# Patient Record
Sex: Female | Born: 1985
Health system: Southern US, Community
[De-identification: ages and names within clinical notes are randomized; demographics above are authoritative.]

## PROBLEM LIST (undated history)

## (undated) DIAGNOSIS — O0900 Supervision of pregnancy with history of infertility, unspecified trimester: Secondary | ICD-10-CM

## (undated) DIAGNOSIS — Z8619 Personal history of other infectious and parasitic diseases: Secondary | ICD-10-CM

## (undated) DIAGNOSIS — O409XX0 Polyhydramnios, unspecified trimester, not applicable or unspecified: Secondary | ICD-10-CM

## (undated) DIAGNOSIS — A630 Anogenital (venereal) warts: Secondary | ICD-10-CM

## (undated) DIAGNOSIS — B019 Varicella without complication: Secondary | ICD-10-CM

## (undated) DIAGNOSIS — E282 Polycystic ovarian syndrome: Secondary | ICD-10-CM

## (undated) DIAGNOSIS — D649 Anemia, unspecified: Secondary | ICD-10-CM

## (undated) DIAGNOSIS — G43909 Migraine, unspecified, not intractable, without status migrainosus: Secondary | ICD-10-CM

## (undated) DIAGNOSIS — O403XX0 Polyhydramnios, third trimester, not applicable or unspecified: Secondary | ICD-10-CM

## (undated) HISTORY — PX: WISDOM TOOTH EXTRACTION: SHX21

## (undated) HISTORY — DX: Anogenital (venereal) warts: A63.0

## (undated) HISTORY — PX: COMBINED ABDOMINOPLASTY AND LIPOSUCTION: SUR284

## (undated) HISTORY — DX: Personal history of other infectious and parasitic diseases: Z86.19

## (undated) HISTORY — PX: MOLE REMOVAL: SHX2046

## (undated) HISTORY — DX: Polyhydramnios, unspecified trimester, not applicable or unspecified: O40.9XX0

## (undated) HISTORY — DX: Migraine, unspecified, not intractable, without status migrainosus: G43.909

## (undated) HISTORY — DX: Varicella without complication: B01.9

## (undated) HISTORY — DX: Polycystic ovarian syndrome: E28.2

## (undated) HISTORY — PX: KNEE ARTHROSCOPY: SUR90

## (undated) HISTORY — DX: Anemia, unspecified: D64.9

---
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
## 2004-03-12 ENCOUNTER — Ambulatory Visit (HOSPITAL_BASED_OUTPATIENT_CLINIC_OR_DEPARTMENT_OTHER): Admission: RE | Admit: 2004-03-12 | Discharge: 2004-03-12 | Payer: Self-pay | Admitting: Orthopedic Surgery

## 2004-03-24 ENCOUNTER — Ambulatory Visit (HOSPITAL_COMMUNITY): Admission: RE | Admit: 2004-03-24 | Discharge: 2004-03-24 | Payer: Self-pay | Admitting: Family Medicine

## 2004-07-04 IMAGING — CT CT ABDOMEN W/O CM
1 series · 16 of 32 positions shown, 20 images · non-contrast
Comparison: none

DUPLICATE COPY for exam association in [facility] ? No change from original report, 07/15/03
CLINICAL DATA: Left-sided flank pain with nausea and difficulty urinating. The patient also has hematuria.  
 CT ABDOMEN WITHOUT CONTRAST; CT PELVIS WITHOUT CONTRAST  - 07/10/03
 No prior studies. 
 No IV or oral contrast was administered.  
 CT ABDOMEN
 No evidence of renal calculi or hydronephrosis.  The ureters in the abdomen have a normal appearance. The rest of the unenhanced abdominal study is unremarkable.  
 IMPRESSION 
 No renal calculi or obstruction. 
 CT PELVIS
 Within the ureters and bladder no calculi are identified.  There is a tiny amount of physiologic free fluid in the pelvis. No evidence of calculi in the ureters or bladder.

[Series 2: renal stone · axial · 0.56mm/px · z∈[-398,-88]mm · 16 of 63 slices shown, 20 images]
[im 5/63  soft-tissue]
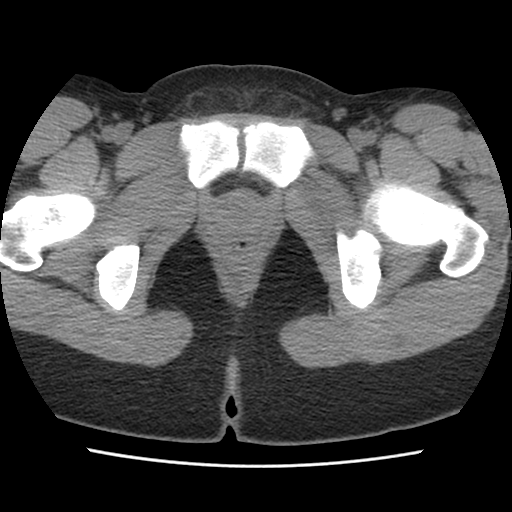
[im 5/63  bone]
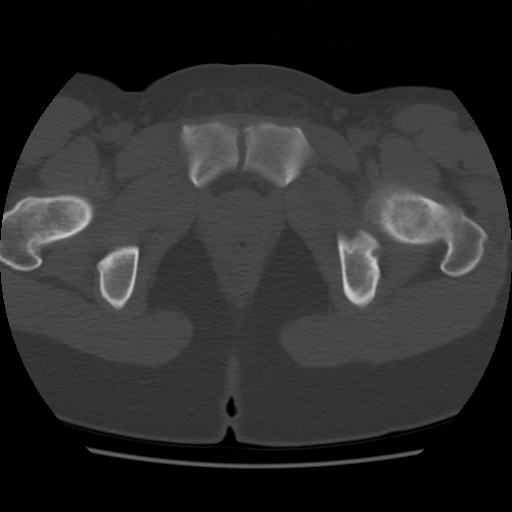
[im 9/63  soft-tissue]
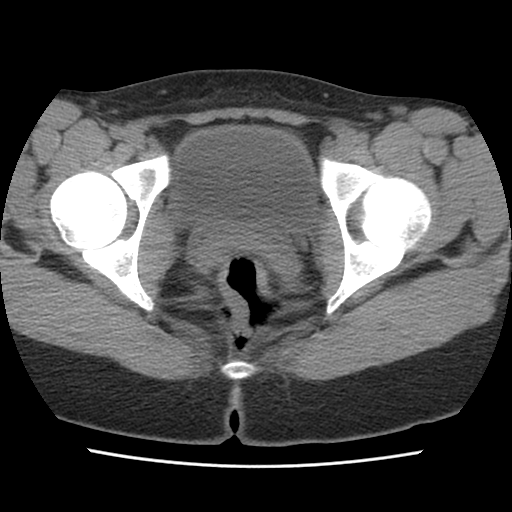
[im 13/63  soft-tissue]
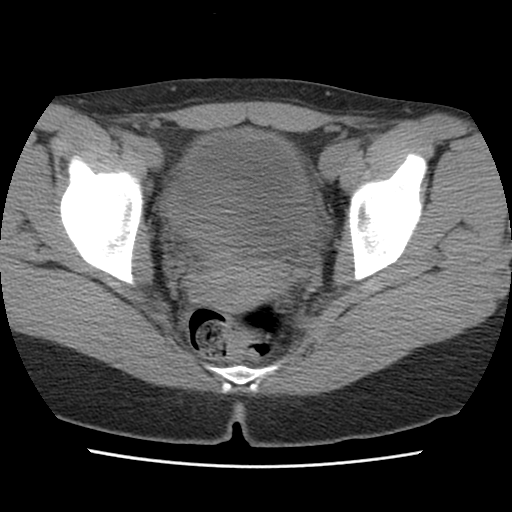
[im 17/63  soft-tissue]
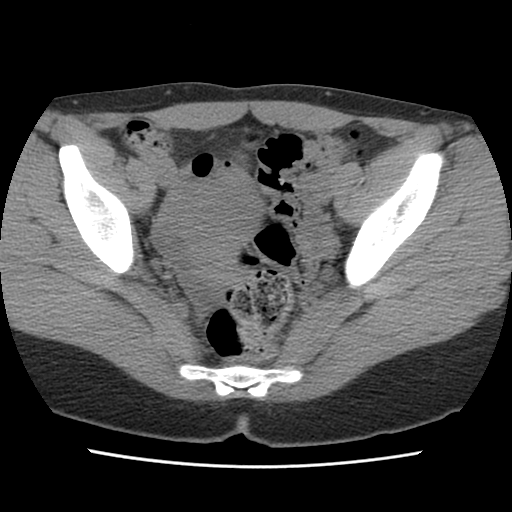
[im 21/63  soft-tissue]
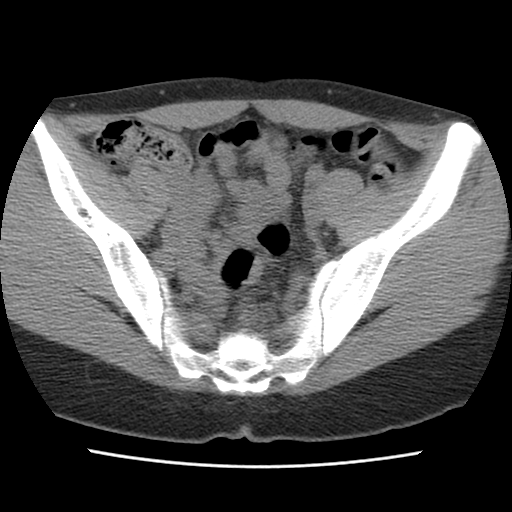
[im 25/63  soft-tissue]
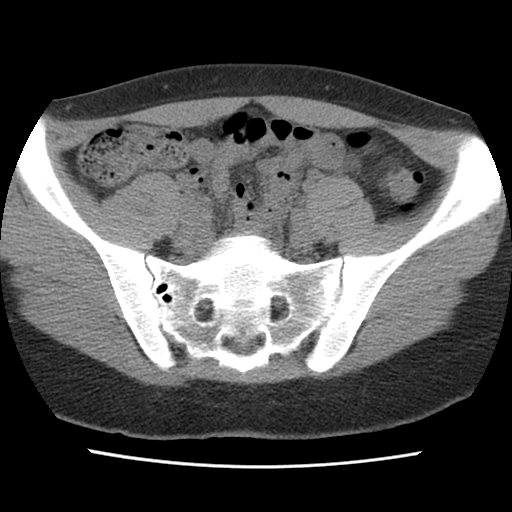
[im 29/63  soft-tissue]
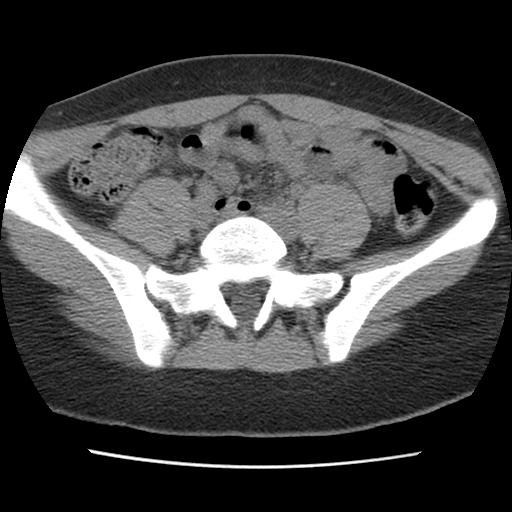
[im 35/63  soft-tissue]
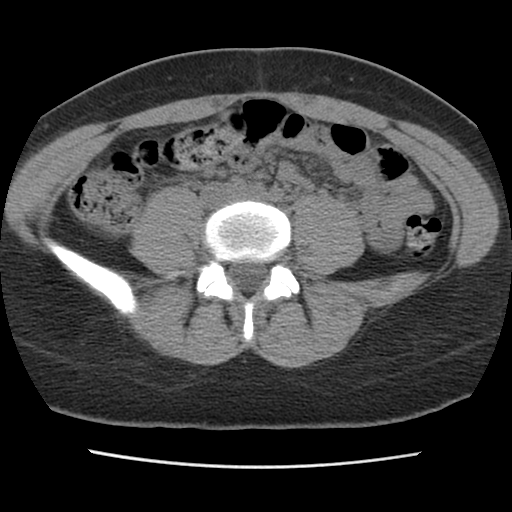
[im 39/63  soft-tissue]
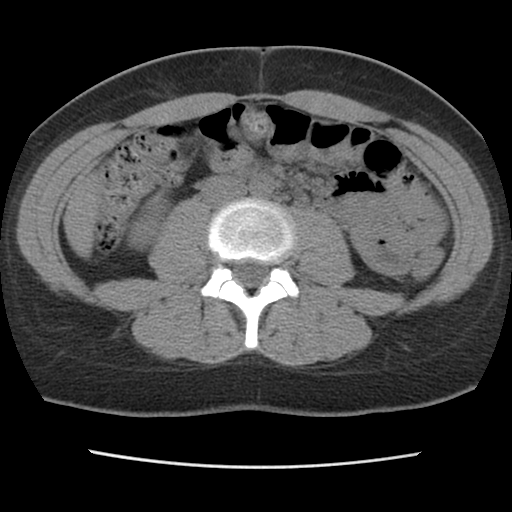
[im 39/63  bone]
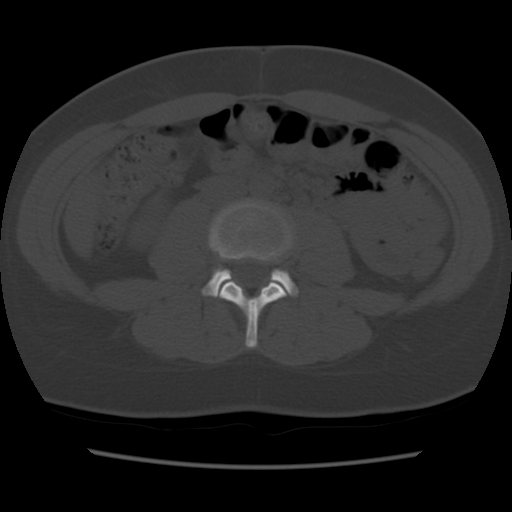
[im 43/63  soft-tissue]
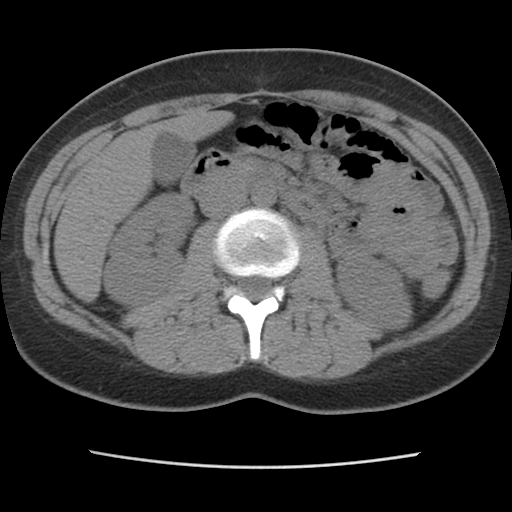
[im 47/63  soft-tissue]
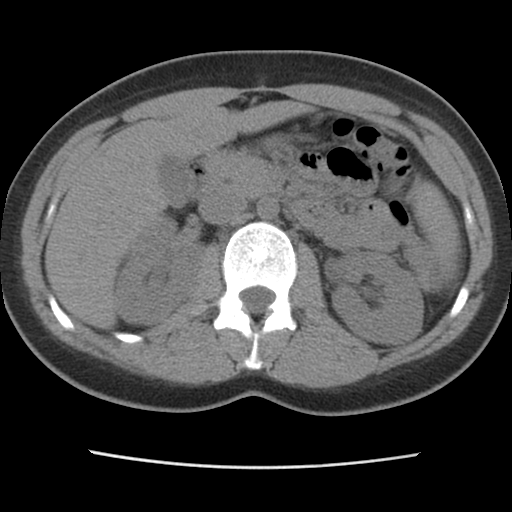
[im 51/63  soft-tissue]
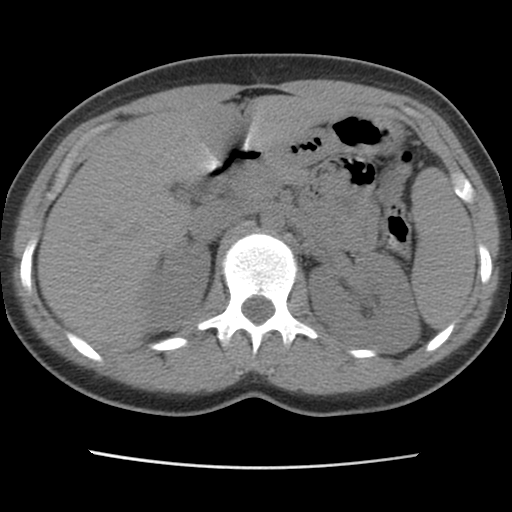
[im 55/63  soft-tissue]
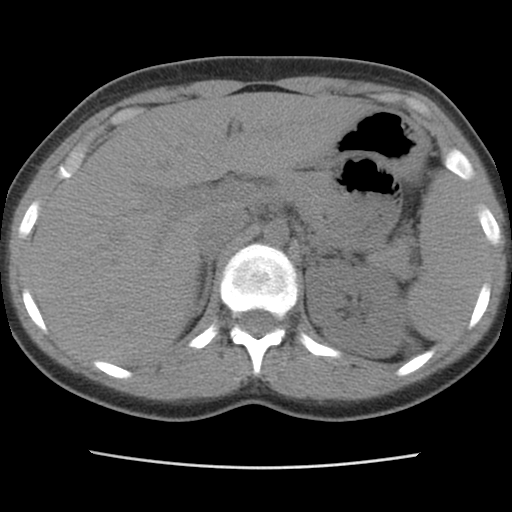
[im 55/63  lung]
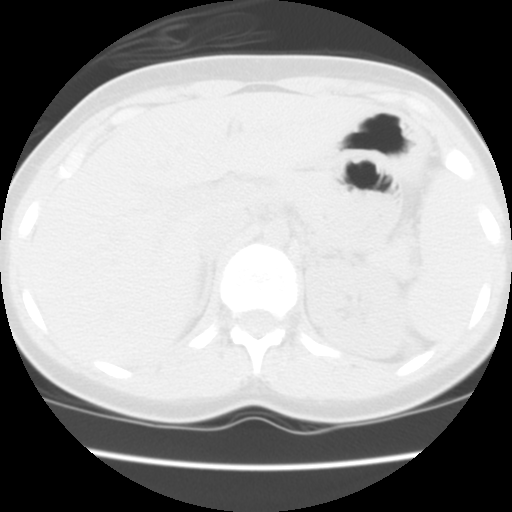
[im 57/63  lung]
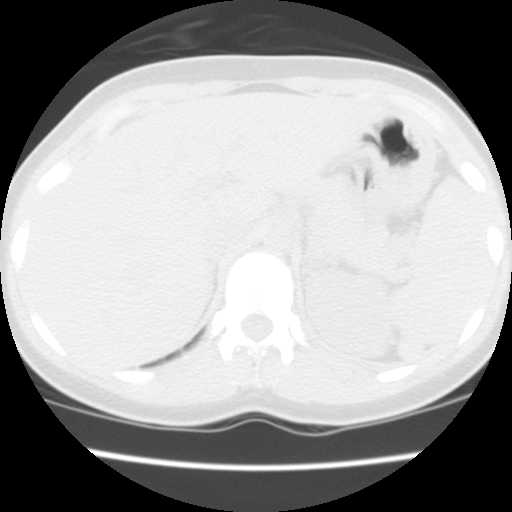
[im 59/63  soft-tissue]
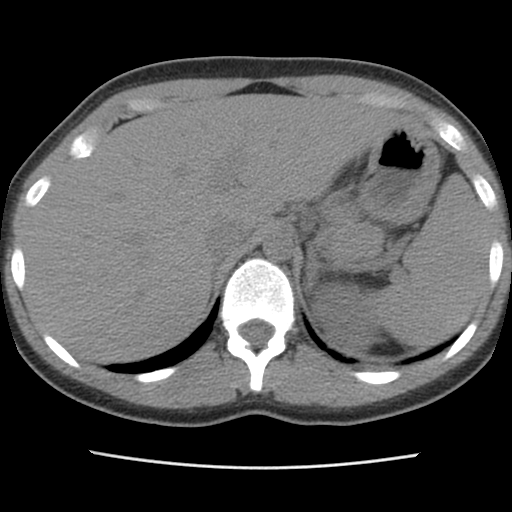
[im 59/63  lung]
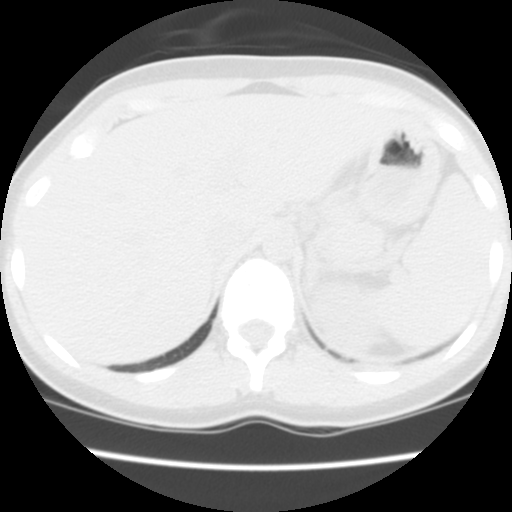
[im 61/63  lung]
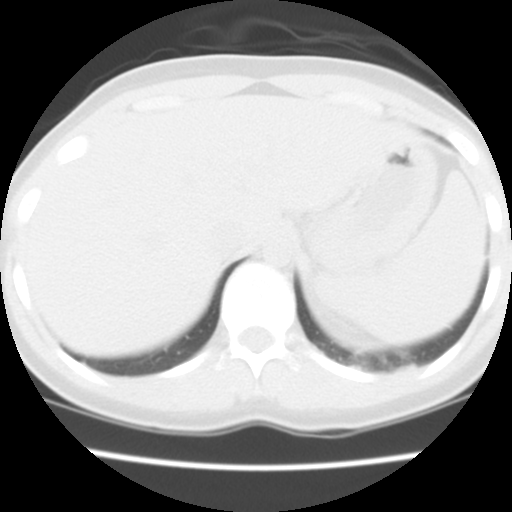

[16 of 32 positions shown; findings below may reference images not displayed]

## 2007-02-22 ENCOUNTER — Emergency Department (HOSPITAL_COMMUNITY): Admission: EM | Admit: 2007-02-22 | Discharge: 2007-02-22 | Payer: Self-pay | Admitting: Family Medicine

## 2010-05-28 NOTE — Op Note (Signed)
NAMEDEMONICA, Janet Holmes             ACCOUNT NO.:  [id number]   MEDICAL RECORD NO.:  [id number]          PATIENT TYPE:  AMB   LOCATION:  DSC                          FACILITY:  [facility]   PHYSICIAN:  Harvie Junior, M.D.   DATE OF BIRTH:  [birth year]   DATE OF PROCEDURE:  03/12/2004  DATE OF DISCHARGE:                                 OPERATIVE REPORT   PREOPERATIVE DIAGNOSIS:  Chondromalacia patella with tight lateral  retinaculum.   POSTOPERATIVE DIAGNOSIS:  Chondromalacia patella with tight lateral  retinaculum.   PROCEDURES:  1.  Chondroplasty posterior patella.  2.  Lateral retinacular release.   SURGEON:  Harvie Junior, M.D.   ASSISTANT:  None.   ANESTHESIA:  MAC with local.   BRIEF HISTORY:  A 25 year old female with long history of having significant  right knee pain.  She ultimately had been seen multiple times.  We have  treated her with anti-inflammatory medications and physical therapy bracing.  None of this seemed to work.  Because of continuing complaints of pain, the  patient is ultimately taken to the operating room for evaluation  arthroscopically and moderate reticular release.   DESCRIPTION OF PROCEDURE:  Patient taken to the operating room and after  adequate anesthesia obtained with MAC, patient placed on the operating  table.  The right leg is prepped and draped in usual sterile fashion.  Following this, routine arthroscopic evaluation of the knee revealed there  was an obvious lateral patellar tilt.  The medial compartment was evaluated  at length and noted to be normal.  There was significant medial plica which  was debrided with __________ anteriorly.  The lateral side was then examined  and noted to have a tight lateral retinaculum.  This was released  arthroscopically.  Patella was allowed to shift superiorly and medially.  At  this point, the patella was evaluated.  There was some chondromalacia  patella which was debrided.  The patella tracking was  more midline at this  point and certainly less pressure in the patellofemoral area. At this point,  the knee was copiously irrigated and suctioned dry.  The arthroscopic  portals were closed with a bandage.  Sterile compressive dressing was  applied as well as a lateral __________.  The patient taken to recovery room  where she was noted to be in satisfactory condition.   ESTIMATED BLOOD LOSS:  None.    JLG/MEDQ  D:  03/12/2004  T:  03/12/2004  Job:  [id number]

## 2010-10-01 LAB — POCT PREGNANCY, URINE
Operator id: 247071
Preg Test, Ur: NEGATIVE

## 2010-10-01 LAB — POCT URINALYSIS DIP (DEVICE)
Bilirubin Urine: NEGATIVE
Glucose, UA: NEGATIVE
Ketones, ur: NEGATIVE
Nitrite: POSITIVE — AB
Operator id: 247071
Protein, ur: 100 — AB
Specific Gravity, Urine: 1.025
Urobilinogen, UA: 0.2
pH: 6.5

## 2011-01-11 NOTE — L&D Delivery Note (Signed)
Delivery Note At 6:35 PM a viable and healthy female was delivered via  (Presentation: ROA).  APGAR: 9, 9; weight pending .   Placenta status: spontaneous,intact .3 vessel  Cord:  with the following complications: Tight Deep Water x one reduced.  Cord pH: na  Anesthesia:  local Episiotomy: none Lacerations: second degree Suture Repair: 2.0 vicryl rapide Est. Blood Loss (mL): 300  Mom to postpartum.  Baby to nursery-stable.  Kahlel Peake J 09/27/2011, 6:52 PM

## 2011-03-07 ENCOUNTER — Other Ambulatory Visit: Payer: Self-pay

## 2011-03-24 LAB — OB RESULTS CONSOLE GC/CHLAMYDIA
Chlamydia: NEGATIVE
Gonorrhea: NEGATIVE

## 2011-03-24 LAB — OB RESULTS CONSOLE ANTIBODY SCREEN: Antibody Screen: NEGATIVE

## 2011-03-24 LAB — OB RESULTS CONSOLE ABO/RH: RH Type: NEGATIVE

## 2011-03-24 LAB — OB RESULTS CONSOLE HEPATITIS B SURFACE ANTIGEN: Hepatitis B Surface Ag: NEGATIVE

## 2011-03-24 LAB — OB RESULTS CONSOLE HIV ANTIBODY (ROUTINE TESTING): HIV: NONREACTIVE

## 2011-03-24 LAB — OB RESULTS CONSOLE RPR: RPR: NONREACTIVE

## 2011-03-24 LAB — OB RESULTS CONSOLE RUBELLA ANTIBODY, IGM: Rubella: UNDETERMINED

## 2011-07-27 ENCOUNTER — Other Ambulatory Visit (HOSPITAL_COMMUNITY): Payer: Self-pay | Admitting: Obstetrics and Gynecology

## 2011-07-27 DIAGNOSIS — O409XX0 Polyhydramnios, unspecified trimester, not applicable or unspecified: Secondary | ICD-10-CM

## 2011-07-29 ENCOUNTER — Ambulatory Visit (HOSPITAL_COMMUNITY)
Admission: RE | Admit: 2011-07-29 | Discharge: 2011-07-29 | Disposition: A | Discharge status: Home / Self Care | Payer: 59 | Source: Ambulatory Visit | Attending: Obstetrics and Gynecology | Admitting: Obstetrics and Gynecology

## 2011-07-29 ENCOUNTER — Encounter (HOSPITAL_COMMUNITY): Payer: Self-pay

## 2011-07-29 DIAGNOSIS — Z363 Encounter for antenatal screening for malformations: Secondary | ICD-10-CM | POA: Insufficient documentation

## 2011-07-29 DIAGNOSIS — O409XX0 Polyhydramnios, unspecified trimester, not applicable or unspecified: Secondary | ICD-10-CM | POA: Insufficient documentation

## 2011-07-29 DIAGNOSIS — O358XX0 Maternal care for other (suspected) fetal abnormality and damage, not applicable or unspecified: Secondary | ICD-10-CM | POA: Insufficient documentation

## 2011-07-29 DIAGNOSIS — Z1389 Encounter for screening for other disorder: Secondary | ICD-10-CM | POA: Insufficient documentation

## 2011-07-29 HISTORY — DX: Supervision of pregnancy with history of infertility, unspecified trimester: O09.00

## 2011-07-29 NOTE — Progress Notes (Signed)
Patient seen today  for ultrasound.  See full report in [facility].  Alpha Gula, MD  Patient is seen today due to polyhydramnios.  The patient reports that she had a normal diabetes screen.  On ultrasound today, an AFI of 27 cm is noted consistent with mild polyhydramnios.  Normal amantomic fetal survey - a normal stomach bubble was clearly visualized.  Findings and limitations of the study were discussed - we reviewed the differential diagnosis for polyhydramnios.  Single IUP at 30 3/7 weeks Normal anatomic fetal survey Fetal growth is appropriate (78th %tile) Mild (idiopathic) polyhydramnios noted  Recommend 2x weekly NSTs.  Consider induction of labor at 39 weeks if undelivered. Recommend follow up ultrasounds as clinically indicated

## 2011-08-29 LAB — OB RESULTS CONSOLE GBS
GBS: NEGATIVE
GBS: POSITIVE

## 2011-09-20 LAB — OB RESULTS CONSOLE GBS: GBS: POSITIVE

## 2011-09-23 ENCOUNTER — Telehealth (HOSPITAL_COMMUNITY): Payer: Self-pay | Admitting: *Deleted

## 2011-09-23 ENCOUNTER — Encounter (HOSPITAL_COMMUNITY): Payer: Self-pay | Admitting: *Deleted

## 2011-09-23 NOTE — Telephone Encounter (Signed)
Preadmission screen  

## 2011-09-26 ENCOUNTER — Encounter (HOSPITAL_COMMUNITY): Payer: Self-pay

## 2011-09-26 ENCOUNTER — Inpatient Hospital Stay (HOSPITAL_COMMUNITY)
Admission: RE | Admit: 2011-09-26 | Discharge: 2011-09-29 | DRG: 775 | Disposition: A | Discharge status: Home / Self Care | Payer: 59 | Source: Ambulatory Visit | Attending: Obstetrics | Admitting: Obstetrics

## 2011-09-26 DIAGNOSIS — Z22330 Carrier of Group B streptococcus: Secondary | ICD-10-CM

## 2011-09-26 DIAGNOSIS — O99892 Other specified diseases and conditions complicating childbirth: Secondary | ICD-10-CM | POA: Diagnosis present

## 2011-09-26 DIAGNOSIS — O403XX0 Polyhydramnios, third trimester, not applicable or unspecified: Secondary | ICD-10-CM | POA: Diagnosis present

## 2011-09-26 DIAGNOSIS — O409XX0 Polyhydramnios, unspecified trimester, not applicable or unspecified: Principal | ICD-10-CM | POA: Diagnosis present

## 2011-09-26 HISTORY — DX: Polyhydramnios, third trimester, not applicable or unspecified: O40.3XX0

## 2011-09-26 LAB — CBC
HCT: 41 % (ref 36.0–46.0)
Hemoglobin: 13.7 g/dL (ref 12.0–15.0)
MCH: 32.3 pg (ref 26.0–34.0)
MCHC: 33.4 g/dL (ref 30.0–36.0)
MCV: 96.7 fL (ref 78.0–100.0)
Platelets: 222 10*3/uL (ref 150–400)
RBC: 4.24 MIL/uL (ref 3.87–5.11)
RDW: 13 % (ref 11.5–15.5)
WBC: 16.8 10*3/uL — ABNORMAL HIGH (ref 4.0–10.5)

## 2011-09-26 MED ORDER — LIDOCAINE HCL (PF) 1 % IJ SOLN
30.0000 mL | INTRAMUSCULAR | Status: DC | PRN
  Filled 2011-09-26: qty 30

## 2011-09-26 MED ORDER — OXYTOCIN BOLUS FROM INFUSION
500.0000 mL | Freq: Once | INTRAVENOUS | Status: AC
  Administered 2011-09-27: 500 mL via INTRAVENOUS
  Filled 2011-09-26: qty 500

## 2011-09-26 MED ORDER — MISOPROSTOL 25 MCG QUARTER TABLET
25.0000 ug | ORAL_TABLET | ORAL | Status: DC | PRN
  Administered 2011-09-26 – 2011-09-27 (×2): 25 ug via VAGINAL
  Filled 2011-09-26 (×2): qty 0.25

## 2011-09-26 MED ORDER — LACTATED RINGERS IV SOLN: 500.0000 mL | INTRAVENOUS | Status: DC | PRN

## 2011-09-26 MED ORDER — OXYTOCIN 40 UNITS IN LACTATED RINGERS INFUSION - SIMPLE MED
1.0000 m[IU]/min | INTRAVENOUS | Status: DC
  Administered 2011-09-27: 2 m[IU]/min via INTRAVENOUS
  Filled 2011-09-26 (×2): qty 1000

## 2011-09-26 MED ORDER — DIPHENHYDRAMINE HCL 25 MG PO CAPS
25.0000 mg | ORAL_CAPSULE | Freq: Four times a day (QID) | ORAL | Status: DC | PRN
  Administered 2011-09-27: 25 mg via ORAL
  Filled 2011-09-26: qty 1

## 2011-09-26 MED ORDER — FLEET ENEMA 7-19 GM/118ML RE ENEM: 1.0000 | ENEMA | RECTAL | Status: DC | PRN

## 2011-09-26 MED ORDER — OXYCODONE-ACETAMINOPHEN 5-325 MG PO TABS: 1.0000 | ORAL_TABLET | ORAL | Status: DC | PRN

## 2011-09-26 MED ORDER — ZOLPIDEM TARTRATE 5 MG PO TABS: 5.0000 mg | ORAL_TABLET | Freq: Every evening | ORAL | Status: DC | PRN

## 2011-09-26 MED ORDER — BUTORPHANOL TARTRATE 1 MG/ML IJ SOLN
1.0000 mg | INTRAMUSCULAR | Status: DC | PRN
  Administered 2011-09-27: 1 mg via INTRAVENOUS
  Filled 2011-09-26: qty 1

## 2011-09-26 MED ORDER — LACTATED RINGERS IV SOLN
INTRAVENOUS | Status: DC
  Administered 2011-09-26 – 2011-09-27 (×3): via INTRAVENOUS

## 2011-09-26 MED ORDER — PENICILLIN G POTASSIUM 5000000 UNITS IJ SOLR
2.5000 10*6.[IU] | INTRAVENOUS | Status: DC
  Filled 2011-09-26 (×2): qty 2.5

## 2011-09-26 MED ORDER — PENICILLIN G POTASSIUM 5000000 UNITS IJ SOLR
5.0000 10*6.[IU] | Freq: Once | INTRAVENOUS | Status: DC
  Filled 2011-09-26: qty 5

## 2011-09-26 MED ORDER — CITRIC ACID-SODIUM CITRATE 334-500 MG/5ML PO SOLN: 30.0000 mL | ORAL | Status: DC | PRN

## 2011-09-26 MED ORDER — TERBUTALINE SULFATE 1 MG/ML IJ SOLN: 0.2500 mg | Freq: Once | INTRAMUSCULAR | Status: AC | PRN

## 2011-09-26 MED ORDER — OXYTOCIN 40 UNITS IN LACTATED RINGERS INFUSION - SIMPLE MED: 62.5000 mL/h | Freq: Once | INTRAVENOUS | Status: DC

## 2011-09-26 MED ORDER — ACETAMINOPHEN 325 MG PO TABS: 650.0000 mg | ORAL_TABLET | ORAL | Status: DC | PRN

## 2011-09-26 MED ORDER — ONDANSETRON HCL 4 MG/2ML IJ SOLN: 4.0000 mg | Freq: Four times a day (QID) | INTRAMUSCULAR | Status: DC | PRN

## 2011-09-26 MED ORDER — IBUPROFEN 600 MG PO TABS: 600.0000 mg | ORAL_TABLET | Freq: Four times a day (QID) | ORAL | Status: DC | PRN

## 2011-09-26 NOTE — Progress Notes (Signed)
Janet Holmes is a 26 y.o. G1P0000 at [redacted]w[redacted]d by LMP admitted for induction of labor due to Hydramnios.(severe)  Subjective: Comfortable  Objective: BP 134/69  Pulse 84  Temp 98.5 F (36.9 C) (Oral)  Resp 20  Ht 5\' 4"  (1.626 m)  Wt 96.616 kg (213 lb)  BMI 36.56 kg/m2  LMP 12/28/2010      FHT:  FHR: 155 bpm, variability: moderate,  accelerations:  Present,  decelerations:  Absent UC:   irregular, every 8 minutes SVE:   Dilation: Closed Effacement (%): 50 Station: -3 Exam by:: c soliz rn cytotec placed  Labs: Lab Results  Component Value Date   WBC 16.8* 09/26/2011   HGB 13.7 09/26/2011   HCT 41.0 09/26/2011   MCV 96.7 09/26/2011   PLT 222 09/26/2011    Assessment / Plan: Severe Polyhydramnios 39 weeks GBS positive  Labor: Progressing normally Preeclampsia:  na Fetal Wellbeing:  Category I Pain Control:  Labor support without medications I/D:  n/a Anticipated MOD:  NSVD  Tashon Capp J 09/26/2011, 9:24 PM

## 2011-09-26 NOTE — H&P (Signed)
NAMEJEREMY, MCLAMB             ACCOUNT NO.:  [id number]  MEDICAL RECORD NO.:  [id number]  LOCATION:  9163                          FACILITY:  [facility]  PHYSICIAN:  Lenoard Aden, M.D.DATE OF BIRTH:  April 19, 1985  DATE OF ADMISSION:  09/26/2011 DATE OF DISCHARGE:                             HISTORY & PHYSICAL   CHIEF COMPLAINT:  Severe polyhydramnios at 39 weeks for induction.  She is a 26 year old white female, G1, P0 at [redacted] weeks gestation with severe polyhydramnios for induction.  ALLERGIES:  She has no known drug allergies.  MEDICATIONS:  Prenatal vitamins.  SOCIAL HISTORY:  She is a nonsmoker, nondrinker.  She denies domestic or physical violence.  FAMILY HISTORY:  She has a family history of stroke, breast, skin cancer, diabetes, colon and uterine cancer, hypertension, bone cancer. She has a history of mole removal and wisdom tooth extraction as her only surgical procedures.  Prenatal course was complicated by severe polyhydramnios with a normal ultrasound performed by [facility].  No evidence of anomaly and GBS positivity.  PHYSICAL EXAMINATION:  GENERAL:  She is a well-developed, well- nourished, white female, in no acute distress. HEENT:  Normal. NECK:  Supple.  Full range of motion. LUNGS:  Clear. HEART:  Regular rhythm. ABDOMEN:  Soft, gravid, nontender.  Estimated fetal weight 8 pounds. Cervix is closed, 60%, vertex, -2. EXTREMITIES:  There are no cords. NEUROLOGIC:  Nonfocal. SKIN:  Intact.  Cytotec was placed.  NST is reactive.  IMPRESSION: 1. A 39-week intrauterine pregnancy. 2. Severe polyhydramnios.  PLAN:  Proceed with induction.  Cytotec tonight and Pitocin in a.m. Penicillin prophylaxis with labor anticipate cautious attempts at vaginal delivery.     Lenoard Aden, M.D.     RJT/MEDQ  D:  09/26/2011  T:  09/26/2011  Job:  [id number]

## 2011-09-27 ENCOUNTER — Encounter (HOSPITAL_COMMUNITY): Payer: Self-pay

## 2011-09-27 ENCOUNTER — Inpatient Hospital Stay (HOSPITAL_COMMUNITY): Payer: 59 | Admitting: Anesthesiology

## 2011-09-27 ENCOUNTER — Encounter (HOSPITAL_COMMUNITY): Payer: Self-pay | Admitting: Anesthesiology

## 2011-09-27 DIAGNOSIS — O403XX0 Polyhydramnios, third trimester, not applicable or unspecified: Secondary | ICD-10-CM

## 2011-09-27 HISTORY — DX: Polyhydramnios, third trimester, not applicable or unspecified: O40.3XX0

## 2011-09-27 LAB — PREPARE RBC (CROSSMATCH)

## 2011-09-27 LAB — ABO/RH: ABO/RH(D): A POS

## 2011-09-27 LAB — RPR: RPR Ser Ql: NONREACTIVE

## 2011-09-27 MED ORDER — ONDANSETRON HCL 4 MG PO TABS: 4.0000 mg | ORAL_TABLET | ORAL | Status: DC | PRN

## 2011-09-27 MED ORDER — SENNOSIDES-DOCUSATE SODIUM 8.6-50 MG PO TABS
2.0000 | ORAL_TABLET | Freq: Every day | ORAL | Status: DC
  Administered 2011-09-27 – 2011-09-28 (×2): 2 via ORAL

## 2011-09-27 MED ORDER — METHYLERGONOVINE MALEATE 0.2 MG/ML IJ SOLN: 0.2000 mg | INTRAMUSCULAR | Status: DC | PRN

## 2011-09-27 MED ORDER — LACTATED RINGERS IV SOLN: 500.0000 mL | Freq: Once | INTRAVENOUS | Status: DC

## 2011-09-27 MED ORDER — IBUPROFEN 600 MG PO TABS
600.0000 mg | ORAL_TABLET | Freq: Four times a day (QID) | ORAL | Status: DC
  Administered 2011-09-27 – 2011-09-29 (×7): 600 mg via ORAL
  Filled 2011-09-27 (×7): qty 1

## 2011-09-27 MED ORDER — LANOLIN HYDROUS EX OINT: TOPICAL_OINTMENT | CUTANEOUS | Status: DC | PRN

## 2011-09-27 MED ORDER — DIBUCAINE 1 % RE OINT: 1.0000 "application " | TOPICAL_OINTMENT | RECTAL | Status: DC | PRN

## 2011-09-27 MED ORDER — STERILE WATER FOR INJECTION IJ SOLN
0.5000 mL | Freq: Once | INTRAMUSCULAR | Status: DC
  Filled 2011-09-27: qty 5

## 2011-09-27 MED ORDER — ZOLPIDEM TARTRATE 5 MG PO TABS: 5.0000 mg | ORAL_TABLET | Freq: Every evening | ORAL | Status: DC | PRN

## 2011-09-27 MED ORDER — BENZOCAINE-MENTHOL 20-0.5 % EX AERO
1.0000 "application " | INHALATION_SPRAY | CUTANEOUS | Status: DC | PRN
  Administered 2011-09-27 – 2011-09-29 (×2): 1 via TOPICAL
  Filled 2011-09-27 (×2): qty 56

## 2011-09-27 MED ORDER — WITCH HAZEL-GLYCERIN EX PADS: 1.0000 "application " | MEDICATED_PAD | CUTANEOUS | Status: DC | PRN

## 2011-09-27 MED ORDER — OXYCODONE-ACETAMINOPHEN 5-325 MG PO TABS
1.0000 | ORAL_TABLET | ORAL | Status: DC | PRN
Start: 2011-09-27 — End: 2011-09-29

## 2011-09-27 MED ORDER — DIPHENHYDRAMINE HCL 25 MG PO CAPS: 25.0000 mg | ORAL_CAPSULE | Freq: Four times a day (QID) | ORAL | Status: DC | PRN

## 2011-09-27 MED ORDER — PHENYLEPHRINE 40 MCG/ML (10ML) SYRINGE FOR IV PUSH (FOR BLOOD PRESSURE SUPPORT)
80.0000 ug | PREFILLED_SYRINGE | INTRAVENOUS | Status: DC | PRN
  Filled 2011-09-27: qty 5

## 2011-09-27 MED ORDER — PRENATAL MULTIVITAMIN CH
1.0000 | ORAL_TABLET | Freq: Every day | ORAL | Status: DC
  Administered 2011-09-27 – 2011-09-29 (×3): 1 via ORAL
  Filled 2011-09-27 (×3): qty 1

## 2011-09-27 MED ORDER — FENTANYL 2.5 MCG/ML BUPIVACAINE 1/10 % EPIDURAL INFUSION (WH - ANES)
14.0000 mL/h | INTRAMUSCULAR | Status: DC
  Filled 2011-09-27: qty 60

## 2011-09-27 MED ORDER — METHYLERGONOVINE MALEATE 0.2 MG PO TABS: 0.2000 mg | ORAL_TABLET | ORAL | Status: DC | PRN

## 2011-09-27 MED ORDER — PENICILLIN G POTASSIUM 5000000 UNITS IJ SOLR
5.0000 10*6.[IU] | Freq: Once | INTRAMUSCULAR | Status: AC
  Administered 2011-09-27: 5 10*6.[IU] via INTRAVENOUS
  Filled 2011-09-27: qty 5

## 2011-09-27 MED ORDER — EPHEDRINE 5 MG/ML INJ: 10.0000 mg | INTRAVENOUS | Status: DC | PRN

## 2011-09-27 MED ORDER — EPHEDRINE 5 MG/ML INJ
10.0000 mg | INTRAVENOUS | Status: DC | PRN
  Filled 2011-09-27: qty 4

## 2011-09-27 MED ORDER — PENICILLIN G POTASSIUM 5000000 UNITS IJ SOLR
2.5000 10*6.[IU] | INTRAVENOUS | Status: DC
  Administered 2011-09-27 (×2): 2.5 10*6.[IU] via INTRAVENOUS
  Filled 2011-09-27 (×5): qty 2.5

## 2011-09-27 MED ORDER — ONDANSETRON HCL 4 MG/2ML IJ SOLN: 4.0000 mg | INTRAMUSCULAR | Status: DC | PRN

## 2011-09-27 MED ORDER — DIPHENHYDRAMINE HCL 50 MG/ML IJ SOLN: 12.5000 mg | INTRAMUSCULAR | Status: DC | PRN

## 2011-09-27 MED ORDER — TETANUS-DIPHTH-ACELL PERTUSSIS 5-2.5-18.5 LF-MCG/0.5 IM SUSP: 0.5000 mL | Freq: Once | INTRAMUSCULAR | Status: DC

## 2011-09-27 MED ORDER — SIMETHICONE 80 MG PO CHEW: 80.0000 mg | CHEWABLE_TABLET | ORAL | Status: DC | PRN

## 2011-09-27 MED ORDER — PHENYLEPHRINE 40 MCG/ML (10ML) SYRINGE FOR IV PUSH (FOR BLOOD PRESSURE SUPPORT): 80.0000 ug | PREFILLED_SYRINGE | INTRAVENOUS | Status: DC | PRN

## 2011-09-27 NOTE — Progress Notes (Signed)
Janet Holmes is a 26 y.o. G1P0000 at [redacted]w[redacted]d by LMP admitted for induction of labor due to Hydramnios.(severe) Subjective:   Objective: BP 126/67  Pulse 66  Temp 97.5 F (36.4 C) (Oral)  Resp 18  Ht 5\' 4"  (1.626 m)  Wt 96.616 kg (213 lb)  BMI 36.56 kg/m2  LMP 12/28/2010      FHT:  FHR: 145 bpm, variability: moderate,  accelerations:  Present,  decelerations:  Present occ early, occ mild variable UC:   regular, every 2-3 minutes SVE:   Dilation: 3.5 Effacement (%): 70 Station: -1 Exam by:: Raliegh Ip RN  Labs: Lab Results  Component Value Date   WBC 16.8* 09/26/2011   HGB 13.7 09/26/2011   HCT 41.0 09/26/2011   MCV 96.7 09/26/2011   PLT 222 09/26/2011    Assessment / Plan: Severe Polyhydramnios  Labor: Progressing normally Preeclampsia:  na Fetal Wellbeing:  Category I Pain Control:  Labor support without medications I/D:  n/a Anticipated MOD:  NSVD  Larance Ratledge J 09/27/2011, 1:09 PM

## 2011-09-27 NOTE — Anesthesia Preprocedure Evaluation (Deleted)
Anesthesia Evaluation  Patient identified by MRN, date of birth, ID band Patient awake    Reviewed: Allergy & Precautions, H&P , Patient's Chart, lab work & pertinent test results  Airway Mallampati: III  TM Distance: >3 FB Neck ROM: full    Dental  (+) Teeth Intact   Pulmonary  breath sounds clear to auscultation        Cardiovascular Rhythm:regular Rate:Normal     Neuro/Psych    GI/Hepatic   Endo/Other  Morbid obesity  Renal/GU      Musculoskeletal   Abdominal   Peds  Hematology   Anesthesia Other Findings       Reproductive/Obstetrics (+) Pregnancy                             Anesthesia Physical Anesthesia Plan  ASA: III  Anesthesia Plan: Epidural   Post-op Pain Management:    Induction:   Airway Management Planned:   Additional Equipment:   Intra-op Plan:   Post-operative Plan:   Informed Consent: I have reviewed the patients History and Physical, chart, labs and discussed the procedure including the risks, benefits and alternatives for the proposed anesthesia with the patient or authorized representative who has indicated his/her understanding and acceptance.   Dental Advisory Given  Plan Discussed with:   Anesthesia Plan Comments: (Labs checked- platelets confirmed with RN in room. Fetal heart tracing, per RN, reported to be stable enough for sitting procedure. Discussed epidural, and patient consents to the procedure:  included risk of possible headache,backache, failed block, allergic reaction, and nerve injury. This patient was asked if she had any questions or concerns before the procedure started.)        Anesthesia Quick Evaluation  

## 2011-09-27 NOTE — Progress Notes (Signed)
Janet Holmes is a 26 y.o. G1P0000 at [redacted]w[redacted]d by LMP admitted for induction of labor due to Hydramnios.(severe)  Subjective: Getting uncomfortable  Objective: BP 134/82  Pulse 85  Temp 97.8 F (36.6 C) (Oral)  Resp 20  Ht 5\' 4"  (1.626 m)  Wt 96.616 kg (213 lb)  BMI 36.56 kg/m2  LMP 12/28/2010      FHT:  FHR: 145 bpm, variability: moderate,  accelerations:  Present,  decelerations:  Absent UC:   regular, every 2 minutes SVE:   Dilation: 2.5 Effacement (%): 70 Station: -1 Exam by:: Dr. Billy Coast AROM- copious and clear  Labs: Lab Results  Component Value Date   WBC 16.8* 09/26/2011   HGB 13.7 09/26/2011   HCT 41.0 09/26/2011   MCV 96.7 09/26/2011   PLT 222 09/26/2011    Assessment / Plan: Induction of labor due to severe polyhydramnios,  progressing well on pitocin  Labor: Progressing normally Preeclampsia:  na Fetal Wellbeing:  Category I Pain Control:  Labor support without medications I/D:  n/a Anticipated MOD:  NSVD  Amdrew Oboyle J 09/27/2011, 8:55 AM

## 2011-09-28 ENCOUNTER — Encounter (HOSPITAL_COMMUNITY): Payer: Self-pay

## 2011-09-28 LAB — CBC
HCT: 38.3 % (ref 36.0–46.0)
Hemoglobin: 12.9 g/dL (ref 12.0–15.0)
MCH: 32.7 pg (ref 26.0–34.0)
MCHC: 33.7 g/dL (ref 30.0–36.0)
MCV: 97.2 fL (ref 78.0–100.0)
Platelets: 192 10*3/uL (ref 150–400)
RBC: 3.94 MIL/uL (ref 3.87–5.11)
RDW: 13.2 % (ref 11.5–15.5)
WBC: 21.3 10*3/uL — ABNORMAL HIGH (ref 4.0–10.5)

## 2011-09-28 LAB — RUBELLA SCREEN: Rubella: 4.9 IU/mL

## 2011-09-28 NOTE — Progress Notes (Signed)
PPD 1 SVD  S:  Reports feeling well.             Tolerating po/ No nausea or vomiting             Bleeding is light             Pain controlled with Motrin             Up ad lib / ambulatory / voiding well   Newborn  Information for the patient's newborn:  Kinzie, Giger [[id number]]  female  breast feeding / no concerns  O:  A & O x 3 NAD             VS:  Filed Vitals:   09/27/11 2028 09/27/11 2130 09/28/11 0125 09/28/11 0535  BP: 126/79 136/79 136/70 117/62  Pulse: 91 89 77 72  Temp: 98.7 F (37.1 C) 97.3 F (36.3 C) 98.1 F (36.7 C) 98.1 F (36.7 C)  TempSrc: Oral Oral Oral Oral  Resp: 18 18 16 18   Height:      Weight:      SpO2: 96% 96% 95%     LABS:  Basename 09/28/11 0540 09/26/11 2020  WBC 21.3* 16.8*  HGB 12.9 13.7  HCT 38.3 41.0  PLT 192 222    Blood type: --/--/A POS, A POS (09/16 2020)  Rubella: Equivocal (03/14 0000)   I&O: I/O last 3 completed shifts: In: -  Out: 300 [Blood:300]        Abdomen: soft, non-tender, non-distended              Fundus: firm, non-tender, @U   Perineum: 2nd deg lac repair intact, mild edema  Lochia: small  Extremities: trace edema, no calf pain or tenderness, neg Homans    A/P: PPD # 1 26 y.o., G1P1001    Active Problems:  Postpartum care following vaginal delivery (9/17, 2nd deg perineal lac)   Doing well - stable status  Routine post partum orders  Rubella equivocal, rpt titer today  Anticipate discharge home in AM.   PAUL,DANIELA, CNM 09/28/2011, 9:09 AM

## 2011-09-29 LAB — TYPE AND SCREEN
ABO/RH(D): A POS
Antibody Screen: NEGATIVE
Unit division: 0
Unit division: 0

## 2011-09-29 MED ORDER — INFLUENZA VIRUS VACC SPLIT PF IM SUSP
0.5000 mL | INTRAMUSCULAR | Status: AC | PRN
  Administered 2011-09-29: 0.5 mL via INTRAMUSCULAR
  Filled 2011-09-29: qty 0.5

## 2011-09-29 MED ORDER — MEASLES, MUMPS & RUBELLA VAC ~~LOC~~ INJ
0.5000 mL | INJECTION | Freq: Once | SUBCUTANEOUS | Status: AC
  Administered 2011-09-29: 0.5 mL via SUBCUTANEOUS
  Filled 2011-09-29: qty 0.5

## 2011-09-29 MED ORDER — IBUPROFEN 600 MG PO TABS: 600.0000 mg | ORAL_TABLET | Freq: Four times a day (QID) | ORAL | Status: DC

## 2011-09-29 NOTE — Progress Notes (Signed)
Post Partum Day #2            Information for the patient's newborn:  Tonji, Elliff [[id number]]  female   Feeding: breast  Subjective: No HA, SOB, CP, F/C, breast symptoms. Pain minimal. Normal vaginal bleeding, no clots.      Objective:  Temp:  [97.9 F (36.6 C)-98.1 F (36.7 C)] 97.9 F (36.6 C) (09/19 0528) Pulse Rate:  [58-93] 58  (09/19 0528) Resp:  [18] 18  (09/19 0528) BP: (94-128)/(55-73) 94/55 mmHg (09/19 0528)  No intake or output data in the 24 hours ending 09/29/11 0851     Basename 09/28/11 0540 09/26/11 2020  WBC 21.3* 16.8*  HGB 12.9 13.7  HCT 38.3 41.0  PLT 192 222    Blood type: --/--/A POS, A POS (09/16 2020) Rubella: 4.9 (09/16 2020) Equivocal   Physical Exam:  General: alert, cooperative and no distress Uterine Fundus: firm Lochia: appropriate Perineum: repair intact, edema none DVT Evaluation: No evidence of DVT seen on physical exam. No significant calf/ankle edema.    Assessment/Plan: PPD # 2 / 26 y.o., G1P1001 S/P:induced vaginal   Active Problems:  Postpartum care following vaginal delivery (9/17, 2nd deg perineal lac) Rubella equivocal, pt consents to MMR vaccine prior to DC Flu vaccine today   normal postpartum exam  Continue current postpartum care  D/C home   LOS: 3 days   Minsa Weddington, CNM, MSN 09/29/2011, 8:51 AM

## 2011-09-29 NOTE — Discharge Summary (Signed)
Obstetric Discharge Summary Reason for Admission: induction of labor and polyhydramnios Prenatal Procedures: NST and ultrasound Intrapartum Procedures: spontaneous vaginal delivery, GBS prophylaxis and epidural Postpartum Procedures: Rubella Ig and flu vaccine Complications-Operative and Postpartum: 2nd degree perineal laceration Hemoglobin  Date Value Range Status  09/28/2011 12.9  12.0 - 15.0 g/dL Final     HCT  Date Value Range Status  09/28/2011 38.3  36.0 - 46.0 % Final    Physical Exam:  General: alert, cooperative and no distress Lochia: appropriate Uterine Fundus: firm Incision: healing well DVT Evaluation: Negative Homan's sign. No significant calf/ankle edema.  Discharge Diagnoses: Term Pregnancy-delivered  Discharge Information: Date: 09/29/2011 Activity: pelvic rest Diet: routine Medications: PNV and Ibuprofen Condition: stable Instructions: refer to practice specific booklet Discharge to: home Follow-up Information    Follow up with Janet Aden, MD. Schedule an appointment as soon as possible for a visit in 6 weeks.   Contact information:   Janet Holmes [place] [phone] [phone]          Newborn Data: Live born female  Birth Weight: 8 lb 2 oz (3685 g) APGAR: 8, 9  Home with mother.  Janet Holmes 09/29/2011, 9:02 AM

## 2011-10-04 ENCOUNTER — Inpatient Hospital Stay (HOSPITAL_COMMUNITY): Admission: AD | Admit: 2011-10-04 | Payer: 59 | Source: Ambulatory Visit | Admitting: Obstetrics and Gynecology

## 2012-07-23 IMAGING — US US OB DETAIL+14 WK
1 series · 12 of 28 positions shown · non-contrast
Comparison: none

[Series 1: us ob detail+14 wk · 0.25mm/px · 12 of 101 slices shown]
[im 4/101]
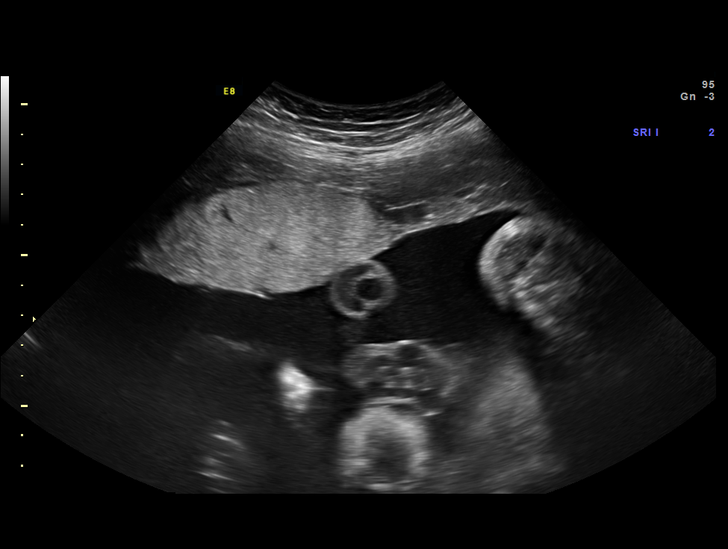
[im 12/101]
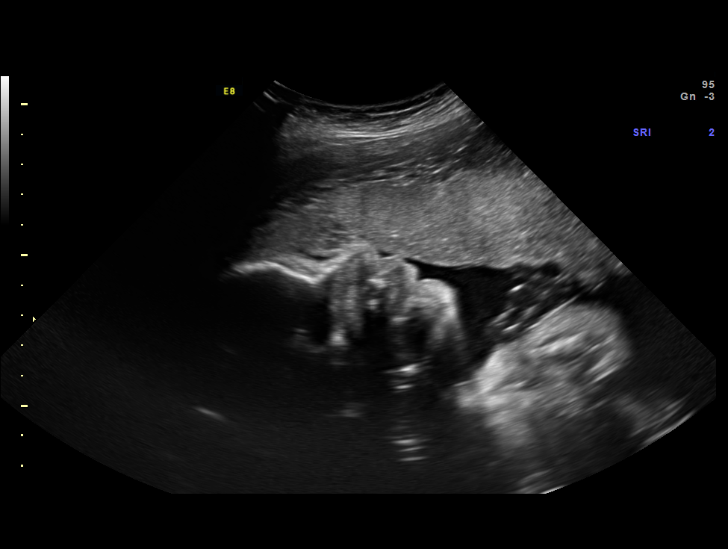
[im 19/101]
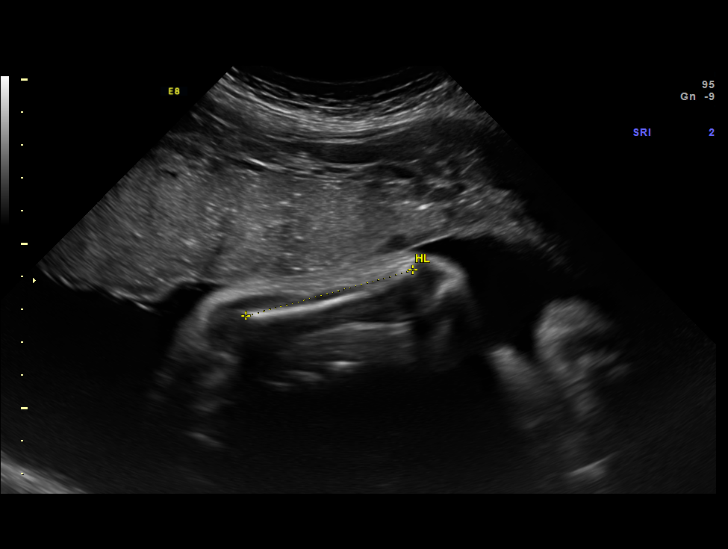
[im 30/101]
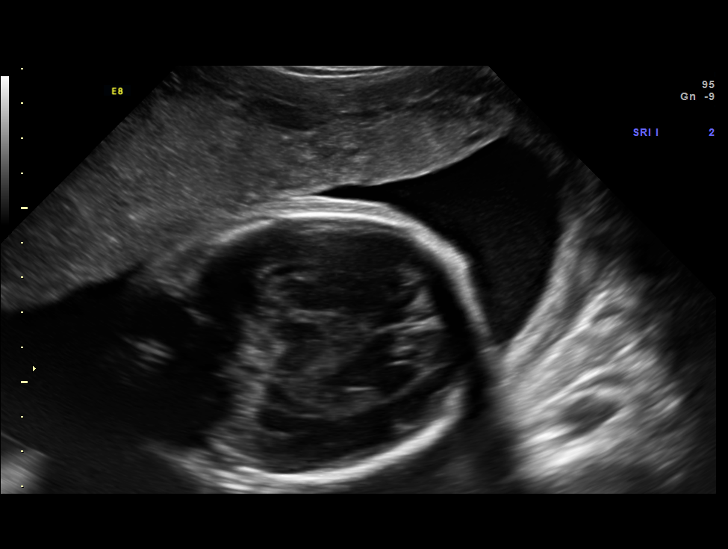
[im 38/101]
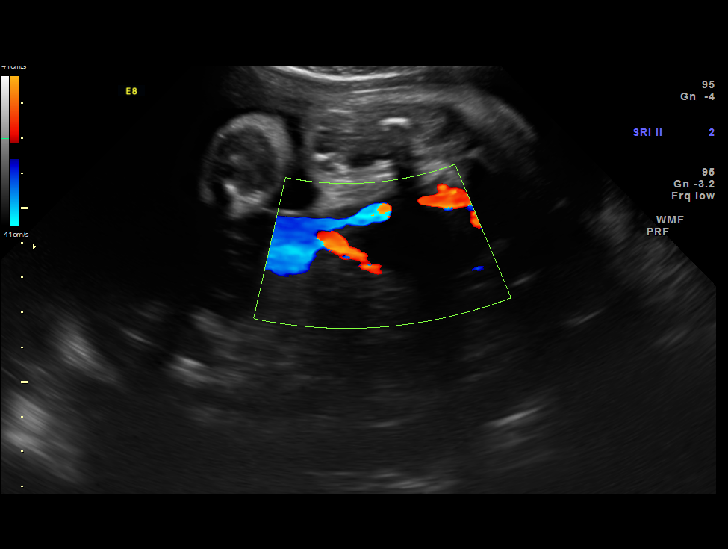
[im 45/101]
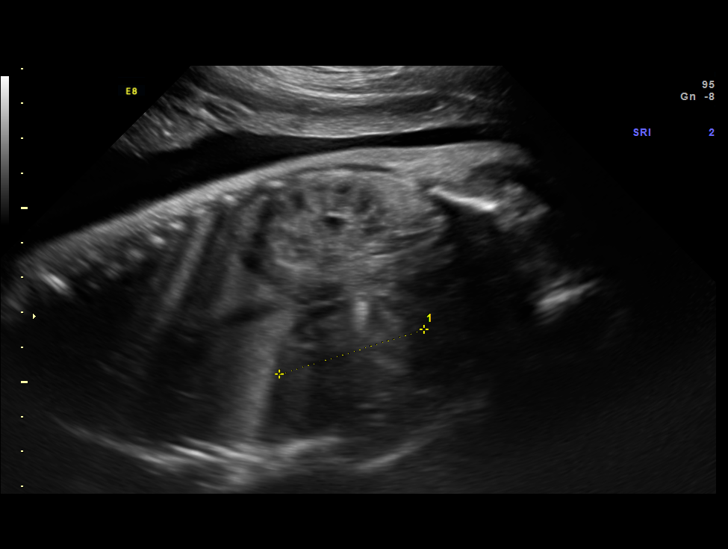
[im 56/101]
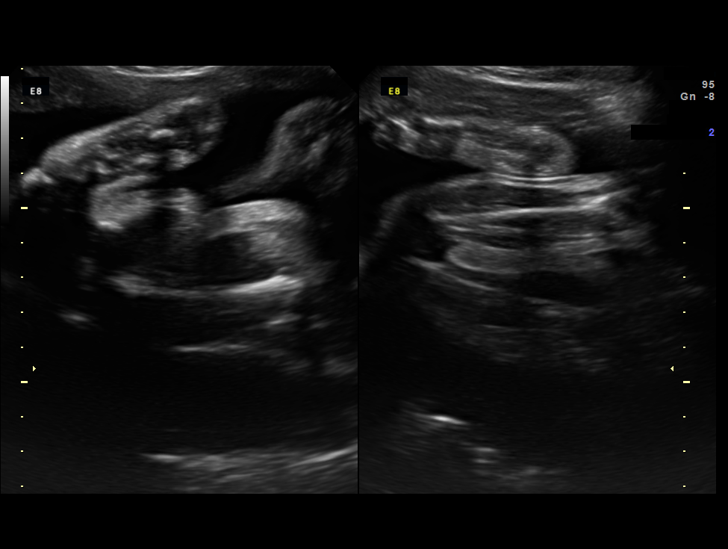
[im 63/101]
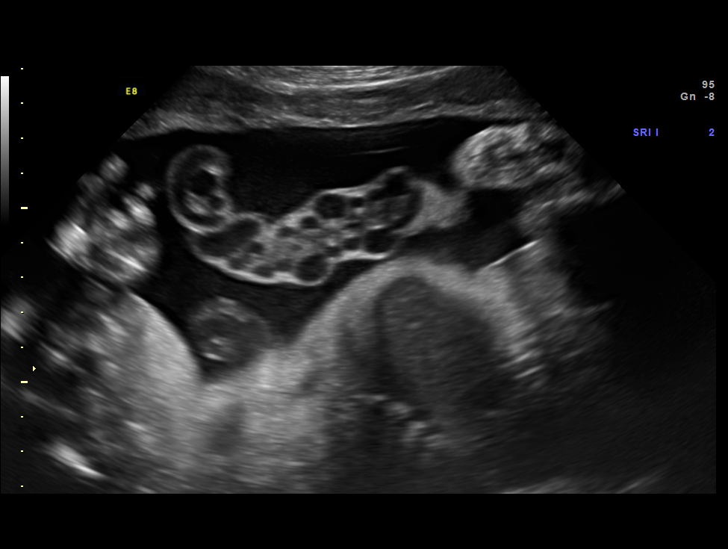
[im 71/101]
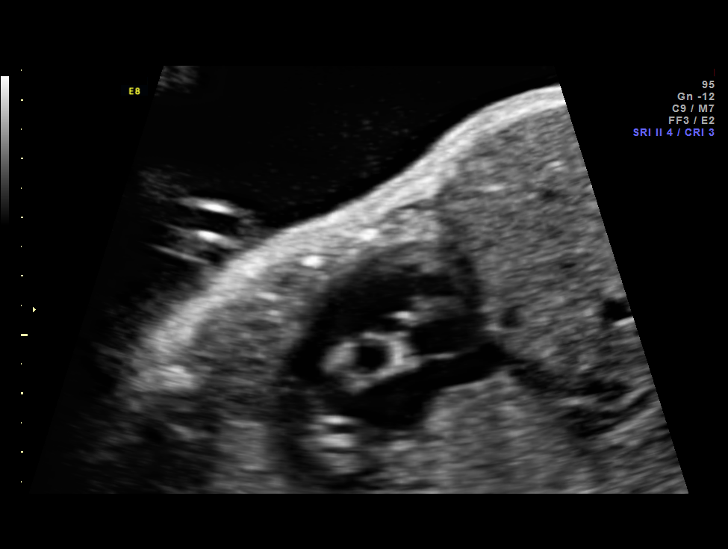
[im 82/101]
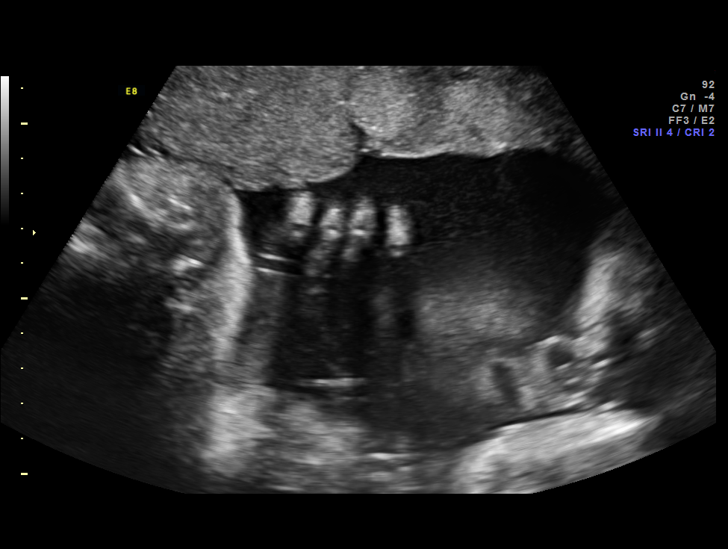
[im 89/101]
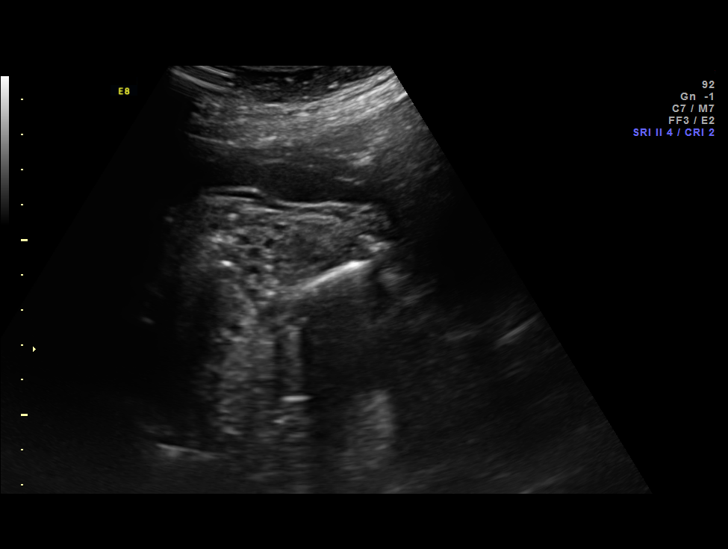
[im 97/101]
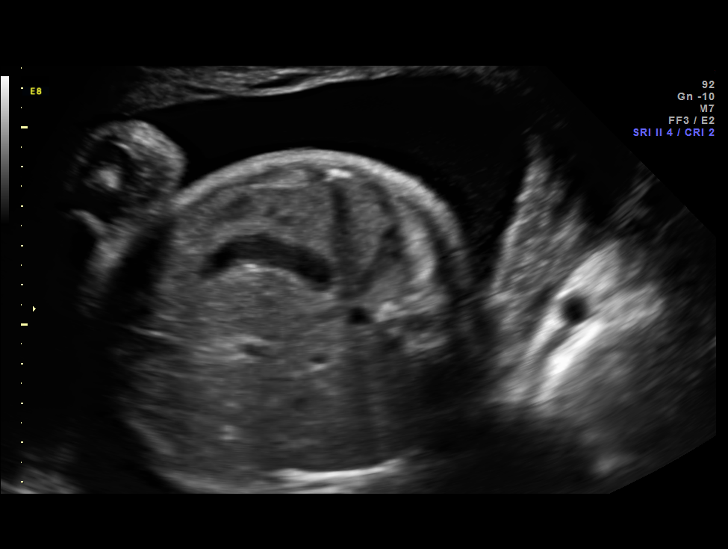

[12 of 28 positions shown; findings below may reference images not displayed]

OBSTETRICS REPORT
                      (Signed Final 07/29/2011 [DATE])

 Order#:         [id number]
Procedures

 US OB DETAIL + 14 WK                                  76811.0
Indications

 Polyhydramnios
 Detailed fetal anatomic survey
 Clomid pregnancy
Fetal Evaluation

 Fetal Heart Rate:  142                          bpm
 Cardiac Activity:  Observed
 Presentation:      Breech
 Placenta:          Anterior, above cervical os

 Amniotic Fluid
 AFI FV:      Subjectively upper-normal
 AFI Sum:     26.78   cm     > 97  %Tile     Larg Pckt:    7.37  cm
 RUQ:   4.37    cm   RLQ:    4.13   cm    LUQ:   7.37    cm   LLQ:    5.91   cm
Biometry

 BPD:     77.7  mm     G. Age:  31w 1d                CI:         72.1   70 - 86
 OFD:    107.8  mm                                    FL/HC:      19.4   19.2 -

 HC:     296.4  mm     G. Age:  32w 5d       79  %    HC/AC:      1.02   0.99 -

 AC:     289.3  mm     G. Age:  33w 0d       97  %    FL/BPD:     74.0   71 - 87
 FL:      57.5  mm     G. Age:  30w 1d       27  %    FL/AC:      19.9   20 - 24
 HUM:       53  mm     G. Age:  30w 6d       61  %
 CER:       39  mm     G. Age:  33w 0d       90  %

 Est. FW:    6666  gm      4 lb 2 oz     78  %
Gestational Age

 Clinical EDD:  30w 3d                                        EDD:   10/04/11
 U/S Today:     31w 5d                                        EDD:   09/25/11
 Best:          30w 3d     Det. By:  Clinical EDD             EDD:   10/04/11
Anatomy
 Cranium:           Appears normal      Aortic Arch:       Not well
                                                           visualized
 Fetal Cavum:       Appears normal      Ductal Arch:       Appears normal
 Ventricles:        Appears normal      Diaphragm:         Appears normal
 Choroid Plexus:    Not well            Stomach:           Appears normal
                    visualized
 Cerebellum:        Appears normal      Abdomen:           Appears normal
 Posterior Fossa:   Appears normal      Abdominal Wall:    Appears nml
                                                           (cord insert,
                                                           abd wall)
 Nuchal Fold:       Not applicable      Cord Vessels:      Appears normal
                    (>20 wks GA)                           (3 vessel cord)
 Face:              Appears normal      Kidneys:           Appear normal
                    (lips/profile/orbit
                    s)
 Heart:             Appears normal      Bladder:           Appears normal
                    (4 chamber &
                    axis)
 RVOT:              Appears normal      Spine:             Not well
                                                           visualized
 LVOT:              Appears normal      Limbs:             Appears normal
                                                           (hands, ankles,
                                                           feet)

 Other:     Technically difficult due to fetal position. Heels and 5th
            digit visualized. Nasal bone visualized.
Cervix Uterus Adnexa

 Cervix:       Not visualized (advanced GA >29 wks)
 Left Ovary:    Within normal limits.
 Right Ovary:   Not visualized.
Comments

 Patient is seen today due to polyhydramnios.  The patient
 reports that she had a normal diabetes screen.  On
 ultrasound today, an AFI of 27 cm is noted consistent with
 mild polyhydramnios.  Normal Ceng fetal survey - a
 normal stomach bubble was clearly visualized.  Findings and
 limitations of the study were discussed - we reviewed the
 differential diagnosis for polyhydramnios.
Impression

 Single IUP at 30 [DATE] weeks
 Normal anatomic fetal survey
 Fetal growth is appropriate (78th %tile)
 Mild (idiopathic) polyhydramnios noted
Recommendations

 Recommend 2x weekly NSTs.  Consider induction of labor at
 39 weeks if undelivered.
 Recommend follow up ultrasounds as clinically indicated

## 2012-12-30 ENCOUNTER — Emergency Department (HOSPITAL_COMMUNITY)
Admission: EM | Admit: 2012-12-30 | Discharge: 2012-12-30 | Disposition: A | Discharge status: Home / Self Care | Payer: 59 | Source: Home / Self Care | Attending: Family Medicine | Admitting: Family Medicine

## 2012-12-30 ENCOUNTER — Encounter (HOSPITAL_COMMUNITY): Payer: Self-pay | Admitting: Emergency Medicine

## 2012-12-30 DIAGNOSIS — J029 Acute pharyngitis, unspecified: Secondary | ICD-10-CM

## 2012-12-30 LAB — POCT RAPID STREP A: Streptococcus, Group A Screen (Direct): NEGATIVE

## 2012-12-30 MED ORDER — AMOXICILLIN 500 MG PO CAPS: 1000.0000 mg | ORAL_CAPSULE | Freq: Two times a day (BID) | ORAL | Status: DC

## 2012-12-30 NOTE — ED Notes (Signed)
Pt is breastfeeding

## 2012-12-30 NOTE — ED Provider Notes (Signed)
Janet Holmes is a 27 y.o. female who presents to Urgent Care today for 2 days of sore throat body aches headache nausea. Multiple sick contacts at work. Patient has tried some ibuprofen which helped a bit. Patient did receive a flu vaccination this year. She notes her tonsils have exudates and is worried about strep throat.   Past Medical History  Diagnosis Date  . Clomid pregnancy   . Anemia   . Condyloma acuminatum   . Polyhydramnios, antepartum complication   . Polycystic ovaries   . H/O varicella   . Polyhydramnios in third trimester for IOL 09/27/2011  . Postpartum care following vaginal delivery (9/17, 2nd deg perineal lac) 09/28/2011   History  Substance Use Topics  . Smoking status: Never Smoker   . Smokeless tobacco: Never Used  . Alcohol Use: No   ROS as above Medications reviewed. No current facility-administered medications for this encounter.   Current Outpatient Prescriptions  Medication Sig Dispense Refill  . ibuprofen (ADVIL,MOTRIN) 600 MG tablet Take 1 tablet (600 mg total) by mouth every 6 (six) hours.  30 tablet  0  . Prenatal Vit-Fe Fumarate-FA (PRENATAL MULTIVITAMIN) TABS Take 1 tablet by mouth at bedtime.      Marland Kitchen acetaminophen (TYLENOL) 500 MG tablet Take 500-1,000 mg by mouth every 6 (six) hours as needed. For pain or headache      . amoxicillin (AMOXIL) 500 MG capsule Take 2 capsules (1,000 mg total) by mouth 2 (two) times daily.  40 capsule  0  . diphenhydrAMINE (BENADRYL) 25 mg capsule Take 25 mg by mouth every 6 (six) hours as needed. For itching      . Docosahexaenoic Acid (DHA COMPLETE) 200 MG CAPS Take 1 capsule by mouth at bedtime.        Exam:  BP 105/60  Pulse 71  Temp(Src) 98.5 F (36.9 C) (Oral)  Resp 18  SpO2 96%  LMP 12/19/2012  Breastfeeding? Yes Gen: Well NAD HEENT: EOMI,  MMM, bilateral tonsillar erythema hypertrophy and exudates. Tympanic membranes normal appearing. Mild anterior cervical lymphadenopathy present  bilaterally. Lungs: Normal work of breathing. CTABL Heart: RRR no MRG Abd: NABS, Soft. NT, ND Exts:warm and well perfused.   Results for orders placed during the hospital encounter of 12/30/12 (from the past 24 hour(s))  POCT RAPID STREP A ([facility] ONLY)     Status: None   Collection Time    12/30/12  1:18 PM      Result Value Range   Streptococcus, Group A Screen (Direct) NEGATIVE  NEGATIVE   No results found.  Assessment and Plan: 27 y.o. female with tonsillitis. Plan for treatment with amoxicillin. Over-the-counter medications as needed for symptom control. Throat culture pending. Discussed warning signs or symptoms. Please see discharge instructions. Patient expresses understanding.      Rodolph Bong, MD 12/30/12 [phone]

## 2012-12-30 NOTE — ED Notes (Signed)
C/O sore throat since Friday night.  Today noticed "streaks" in throat.  Exudate noted.  Denies fevers.  Has been taking IBU.

## 2013-01-01 LAB — CULTURE, GROUP A STREP

## 2013-01-12 ENCOUNTER — Emergency Department (HOSPITAL_COMMUNITY)
Admission: EM | Admit: 2013-01-12 | Discharge: 2013-01-12 | Disposition: A | Discharge status: Home / Self Care | Payer: 59 | Source: Home / Self Care

## 2013-01-12 ENCOUNTER — Encounter (HOSPITAL_COMMUNITY): Payer: Self-pay | Admitting: Emergency Medicine

## 2013-01-12 DIAGNOSIS — J069 Acute upper respiratory infection, unspecified: Secondary | ICD-10-CM

## 2013-01-12 MED ORDER — TRIAMCINOLONE ACETONIDE 40 MG/ML IJ SUSP
40.0000 mg | Freq: Once | INTRAMUSCULAR | Status: AC
  Administered 2013-01-12: 40 mg via INTRAMUSCULAR

## 2013-01-12 MED ORDER — TRIAMCINOLONE ACETONIDE 40 MG/ML IJ SUSP
INTRAMUSCULAR | Status: AC
  Filled 2013-01-12: qty 1

## 2013-01-12 MED ORDER — IPRATROPIUM BROMIDE 0.06 % NA SOLN: 2.0000 | Freq: Four times a day (QID) | NASAL | Status: DC

## 2013-01-12 NOTE — Discharge Instructions (Signed)
Upper Respiratory Infection, Adult Plenty of fluids Low dose dextromethorphan for cough An upper respiratory infection (URI) is also sometimes known as the common cold. The upper respiratory tract includes the nose, sinuses, throat, trachea, and bronchi. Bronchi are the airways leading to the lungs. Most people improve within 1 week, but symptoms can last up to 2 weeks. A residual cough may last even longer.  CAUSES Many different viruses can infect the tissues lining the upper respiratory tract. The tissues become irritated and inflamed and often become very moist. Mucus production is also common. A cold is contagious. You can easily spread the virus to others by oral contact. This includes kissing, sharing a glass, coughing, or sneezing. Touching your mouth or nose and then touching a surface, which is then touched by another person, can also spread the virus. SYMPTOMS  Symptoms typically develop 1 to 3 days after you come in contact with a cold virus. Symptoms vary from person to person. They may include:  Runny nose.  Sneezing.  Nasal congestion.  Sinus irritation.  Sore throat.  Loss of voice (laryngitis).  Cough.  Fatigue.  Muscle aches.  Loss of appetite.  Headache.  Low-grade fever. DIAGNOSIS  You might diagnose your own cold based on familiar symptoms, since most people get a cold 2 to 3 times a year. Your caregiver can confirm this based on your exam. Most importantly, your caregiver can check that your symptoms are not due to another disease such as strep throat, sinusitis, pneumonia, asthma, or epiglottitis. Blood tests, throat tests, and X-rays are not necessary to diagnose a common cold, but they may sometimes be helpful in excluding other more serious diseases. Your caregiver will decide if any further tests are required. RISKS AND COMPLICATIONS  You may be at risk for a more severe case of the common cold if you smoke cigarettes, have chronic heart disease (such as  heart failure) or lung disease (such as asthma), or if you have a weakened immune system. The very young and very old are also at risk for more serious infections. Bacterial sinusitis, middle ear infections, and bacterial pneumonia can complicate the common cold. The common cold can worsen asthma and chronic obstructive pulmonary disease (COPD). Sometimes, these complications can require emergency medical care and may be life-threatening. PREVENTION  The best way to protect against getting a cold is to practice good hygiene. Avoid oral or hand contact with people with cold symptoms. Wash your hands often if contact occurs. There is no clear evidence that vitamin C, vitamin E, echinacea, or exercise reduces the chance of developing a cold. However, it is always recommended to get plenty of rest and practice good nutrition. TREATMENT  Treatment is directed at relieving symptoms. There is no cure. Antibiotics are not effective, because the infection is caused by a virus, not by bacteria. Treatment may include:  Increased fluid intake. Sports drinks offer valuable electrolytes, sugars, and fluids.  Breathing heated mist or steam (vaporizer or shower).  Eating chicken soup or other clear broths, and maintaining good nutrition.  Getting plenty of rest.  Using gargles or lozenges for comfort.  Controlling fevers with ibuprofen or acetaminophen as directed by your caregiver.  Increasing usage of your inhaler if you have asthma. Zinc gel and zinc lozenges, taken in the first 24 hours of the common cold, can shorten the duration and lessen the severity of symptoms. Pain medicines may help with fever, muscle aches, and throat pain. A variety of non-prescription medicines are available  to treat congestion and runny nose. Your caregiver can make recommendations and may suggest nasal or lung inhalers for other symptoms.  HOME CARE INSTRUCTIONS   Only take over-the-counter or prescription medicines for pain,  discomfort, or fever as directed by your caregiver.  Use a warm mist humidifier or inhale steam from a shower to increase air moisture. This may keep secretions moist and make it easier to breathe.  Drink enough water and fluids to keep your urine clear or pale yellow.  Rest as needed.  Return to work when your temperature has returned to normal or as your caregiver advises. You may need to stay home longer to avoid infecting others. You can also use a face mask and careful hand washing to prevent spread of the virus. SEEK MEDICAL CARE IF:   After the first few days, you feel you are getting worse rather than better.  You need your caregiver's advice about medicines to control symptoms.  You develop chills, worsening shortness of breath, or brown or red sputum. These may be signs of pneumonia.  You develop yellow or brown nasal discharge or pain in the face, especially when you bend forward. These may be signs of sinusitis.  You develop a fever, swollen neck glands, pain with swallowing, or white areas in the back of your throat. These may be signs of strep throat. SEEK IMMEDIATE MEDICAL CARE IF:   You have a fever.  You develop severe or persistent headache, ear pain, sinus pain, or chest pain.  You develop wheezing, a prolonged cough, cough up blood, or have a change in your usual mucus (if you have chronic lung disease).  You develop sore muscles or a stiff neck. Document Released: 06/22/2000 Document Revised: 03/21/2011 Document Reviewed: 04/30/2010 [facility] Patient Information 2014 [place], [place].

## 2013-01-12 NOTE — ED Provider Notes (Signed)
CSN: [id number]     Arrival date & time 01/12/13  1104 History   First MD Initiated Contact with Patient 01/12/13 1327     Chief Complaint  Patient presents with  . Cough   (Consider location/radiation/quality/duration/timing/severity/associated sxs/prior Treatment) HPI Comments: 28 year old female presents with complaints of cough, sneezing, bodyaches, nasal congestion, PND and scratchy painful throat. Approximately one week ago when symptoms started she was prescribed a course of amoxicillin but that did not help her. She is breast-feeding.    Past Medical History  Diagnosis Date  . Clomid pregnancy   . Anemia   . Condyloma acuminatum   . Polyhydramnios, antepartum complication   . Polycystic ovaries   . H/O varicella   . Polyhydramnios in third trimester for IOL 09/27/2011  . Postpartum care following vaginal delivery (9/17, 2nd deg perineal lac) 09/28/2011   Past Surgical History  Procedure Laterality Date  . Wisdom tooth extraction    . Mole removal    . Knee arthroscopy      R   Family History  Problem Relation Age of Onset  . Cancer Paternal Grandmother     breast, skin  . Hypertension Paternal Grandmother   . Stroke Paternal Grandfather   . Diabetes Paternal Grandfather   . Hypertension Paternal Grandfather   . Cancer Mother     skin  . Cancer Maternal Grandmother     breast  . Hypertension Maternal Grandfather    History  Substance Use Topics  . Smoking status: Never Smoker   . Smokeless tobacco: Never Used  . Alcohol Use: No   OB History   Grav Para Term Preterm Abortions TAB SAB Ect Mult Living   1 1 1  0 0 0 0 0 0 1     Review of Systems  Constitutional: Positive for activity change. Negative for fever, chills, appetite change and fatigue.  HENT: Positive for congestion, postnasal drip, rhinorrhea and sore throat. Negative for facial swelling.   Eyes: Negative.   Respiratory: Negative.   Cardiovascular: Negative.   Musculoskeletal: Negative for neck  pain and neck stiffness.  Skin: Negative for pallor and rash.  Neurological: Negative.     Allergies  Review of patient's allergies indicates no known allergies.  Home Medications   Current Outpatient Rx  Name  Route  Sig  Dispense  Refill  . acetaminophen (TYLENOL) 500 MG tablet   Oral   Take 500-1,000 mg by mouth every 6 (six) hours as needed. For pain or headache         . ibuprofen (ADVIL,MOTRIN) 600 MG tablet   Oral   Take 1 tablet (600 mg total) by mouth every 6 (six) hours.   30 tablet   0   . Prenatal Vit-Fe Fumarate-FA (PRENATAL MULTIVITAMIN) TABS   Oral   Take 1 tablet by mouth at bedtime.         . diphenhydrAMINE (BENADRYL) 25 mg capsule   Oral   Take 25 mg by mouth every 6 (six) hours as needed. For itching         . Docosahexaenoic Acid (DHA COMPLETE) 200 MG CAPS   Oral   Take 1 capsule by mouth at bedtime.         Marland Kitchen ipratropium (ATROVENT) 0.06 % nasal spray   Each Nare   Place 2 sprays into both nostrils 4 (four) times daily.   15 mL   12    BP 110/69  Pulse 71  Temp(Src) 98.8 F (37.1 C) (Oral)  Resp 18  SpO2 100%  LMP 12/19/2012  Breastfeeding? Yes Physical Exam  Nursing note and vitals reviewed. Constitutional: She is oriented to person, place, and time. She appears well-developed and well-nourished. No distress.  HENT:  Right Ear: External ear normal.  Left Ear: External ear normal.  OP with mild erythema, clear PND and no exudates.  Eyes: Conjunctivae are normal.  Neck: Normal range of motion. Neck supple.  Cardiovascular: Normal rate, regular rhythm and normal heart sounds.   Pulmonary/Chest: Effort normal. No respiratory distress. She has wheezes.  Musculoskeletal: Normal range of motion. She exhibits no edema.  Lymphadenopathy:    She has no cervical adenopathy.  Neurological: She is alert and oriented to person, place, and time.  Skin: Skin is warm and dry. No rash noted.  Psychiatric: She has a normal mood and affect.     ED Course  Procedures (including critical care time) Labs Review Labs Reviewed - No data to display Imaging Review No results found.    MDM   1. URI (upper respiratory infection)      The patient is lactating which limits most of the medications for her symptoms. She is typical symptoms of URI and not so much the flu. She also had a flu shot. Will prescribe Atrovent nasal spray to use as directed May use small doses of dextromethorphan, drink plenty of fluids stay well hydrated. Declined albuterol neb.   Hayden Rasmussenavid Magaby Rumberger, NP 01/12/13 1358

## 2013-01-12 NOTE — ED Notes (Signed)
4 days of cough, runny nose, and sore throat  Without fever

## 2013-01-12 NOTE — ED Provider Notes (Signed)
Medical screening examination/treatment/procedure(s) were performed by resident physician or non-physician practitioner and as supervising physician I was immediately available for consultation/collaboration.   KINDL,JAMES DOUGLAS MD.   James D Kindl, MD 01/12/13 2025 

## 2013-11-11 ENCOUNTER — Encounter (HOSPITAL_COMMUNITY): Payer: Self-pay | Admitting: Emergency Medicine

## 2014-06-13 ENCOUNTER — Encounter (HOSPITAL_COMMUNITY): Payer: Self-pay | Admitting: Emergency Medicine

## 2014-06-13 ENCOUNTER — Emergency Department (HOSPITAL_COMMUNITY)
Admission: EM | Admit: 2014-06-13 | Discharge: 2014-06-13 | Disposition: A | Discharge status: Home / Self Care | Payer: 59 | Source: Home / Self Care | Attending: Family Medicine | Admitting: Family Medicine

## 2014-06-13 DIAGNOSIS — H1033 Unspecified acute conjunctivitis, bilateral: Secondary | ICD-10-CM

## 2014-06-13 MED ORDER — TOBRAMYCIN-DEXAMETHASONE 0.3-0.1 % OP SUSP: 1.0000 [drp] | OPHTHALMIC | Status: DC

## 2014-06-13 NOTE — ED Notes (Signed)
Pt has bilateral red eyes, with swelling and discharge.  Pt states it started on Sunday in her right eye.  She was using her daughter's Polytrim drops but it has since spread to her left eye.

## 2014-06-13 NOTE — ED Provider Notes (Signed)
CSN: [id number]     Arrival date & time 06/13/14  0846 History   First MD Initiated Contact with Patient 06/13/14 0913     Chief Complaint  Patient presents with  . Conjunctivitis   (Consider location/radiation/quality/duration/timing/severity/associated sxs/prior Treatment) Patient is a 29 y.o. female presenting with conjunctivitis. The history is provided by the patient.  Conjunctivitis This is a new problem. The current episode started more than 2 days ago (wears contacts, no other exposure reported.). The problem has been gradually worsening.    Past Medical History  Diagnosis Date  . Clomid pregnancy   . Anemia   . Condyloma acuminatum   . Polyhydramnios, antepartum complication   . Polycystic ovaries   . H/O varicella   . Polyhydramnios in third trimester for IOL 09/27/2011  . Postpartum care following vaginal delivery (9/17, 2nd deg perineal lac) 09/28/2011   Past Surgical History  Procedure Laterality Date  . Wisdom tooth extraction    . Mole removal    . Knee arthroscopy      R   Family History  Problem Relation Age of Onset  . Cancer Paternal Grandmother     breast, skin  . Hypertension Paternal Grandmother   . Stroke Paternal Grandfather   . Diabetes Paternal Grandfather   . Hypertension Paternal Grandfather   . Cancer Mother     skin  . Cancer Maternal Grandmother     breast  . Hypertension Maternal Grandfather    History  Substance Use Topics  . Smoking status: Never Smoker   . Smokeless tobacco: Never Used  . Alcohol Use: No   OB History    Gravida Para Term Preterm AB TAB SAB Ectopic Multiple Living   1 1 1  0 0 0 0 0 0 1     Review of Systems  Constitutional: Negative.   HENT: Negative.   Eyes: Positive for discharge and redness.    Allergies  Review of patient's allergies indicates no known allergies.  Home Medications   Prior to Admission medications   Medication Sig Start Date End Date Taking? Authorizing Provider  Prenatal Vit-Fe  Fumarate-FA (PRENATAL MULTIVITAMIN) TABS Take 1 tablet by mouth at bedtime.   Yes Historical Provider, MD  acetaminophen (TYLENOL) 500 MG tablet Take 500-1,000 mg by mouth every 6 (six) hours as needed. For pain or headache    Historical Provider, MD  diphenhydrAMINE (BENADRYL) 25 mg capsule Take 25 mg by mouth every 6 (six) hours as needed. For itching    Historical Provider, MD  Docosahexaenoic Acid (DHA COMPLETE) 200 MG CAPS Take 1 capsule by mouth at bedtime.    Historical Provider, MD  ibuprofen (ADVIL,MOTRIN) 600 MG tablet Take 1 tablet (600 mg total) by mouth every 6 (six) hours. 09/29/11   Arlan Organaniela Paul, CNM  ipratropium (ATROVENT) 0.06 % nasal spray Place 2 sprays into both nostrils 4 (four) times daily. 01/12/13   Hayden Rasmussenavid Mabe, NP  tobramycin-dexamethasone [facility](TOBRADEX) ophthalmic solution Place 1 drop into both eyes every 4 (four) hours while awake. 06/13/14   Linna HoffJames D Cowen Pesqueira, MD   BP 121/56 mmHg  Pulse 64  Temp(Src) 98.3 F (36.8 C) (Oral)  Resp 16  SpO2 99%  LMP 06/10/2014 (Exact Date) Physical Exam  Constitutional: She appears well-developed and well-nourished.  HENT:  Head: Normocephalic.  Mouth/Throat: Oropharynx is clear and moist.  Eyes: EOM are normal. Pupils are equal, round, and reactive to light. Right eye exhibits discharge. Right eye exhibits no chemosis, no exudate and no hordeolum. No foreign  body present in the right eye. Left eye exhibits discharge. Left eye exhibits no chemosis, no exudate and no hordeolum. No foreign body present in the left eye. Right conjunctiva is injected. Left conjunctiva is injected.  Nursing note and vitals reviewed.   ED Course  Procedures (including critical care time) Labs Review Labs Reviewed - No data to display  Imaging Review No results found.   MDM   1. Acute conjunctivitis, bilateral        Linna Hoff, MD 06/13/14 [phone]

## 2014-06-13 NOTE — Discharge Instructions (Signed)
Warm cloth soak to eyes before drops, no contacts for 7- 10 d, change eye solutions, no more polytrim drops, see eye doctor if further problems next week.

## 2014-07-23 ENCOUNTER — Encounter: Payer: Self-pay | Admitting: Emergency Medicine

## 2014-07-23 ENCOUNTER — Emergency Department
Admission: EM | Admit: 2014-07-23 | Discharge: 2014-07-23 | Disposition: A | Discharge status: Home / Self Care | Payer: 59 | Source: Home / Self Care | Attending: Family Medicine | Admitting: Family Medicine

## 2014-07-23 DIAGNOSIS — J029 Acute pharyngitis, unspecified: Secondary | ICD-10-CM

## 2014-07-23 LAB — POCT RAPID STREP A (OFFICE): Rapid Strep A Screen: NEGATIVE

## 2014-07-23 NOTE — ED Provider Notes (Signed)
CSN: [id number]     Arrival date & time 07/23/14  0935 History   First MD Initiated Contact with Patient 07/23/14 1006     Chief Complaint  Patient presents with  . Sore Throat      HPI Comments: Patient complains of four day history of typical cold-like symptoms including mild sore throat, headache, fatigue, and cough but no sinus congestion.  The history is provided by the patient.    Past Medical History  Diagnosis Date  . Clomid pregnancy   . Anemia   . Condyloma acuminatum   . Polyhydramnios, antepartum complication   . Polycystic ovaries   . H/O varicella   . Polyhydramnios in third trimester for IOL 09/27/2011  . Postpartum care following vaginal delivery (9/17, 2nd deg perineal lac) 09/28/2011   Past Surgical History  Procedure Laterality Date  . Wisdom tooth extraction    . Mole removal    . Knee arthroscopy      R   Family History  Problem Relation Age of Onset  . Cancer Paternal Grandmother     breast, skin  . Hypertension Paternal Grandmother   . Stroke Paternal Grandfather   . Diabetes Paternal Grandfather   . Hypertension Paternal Grandfather   . Cancer Mother     skin  . Cancer Maternal Grandmother     breast  . Hypertension Maternal Grandfather    History  Substance Use Topics  . Smoking status: Never Smoker   . Smokeless tobacco: Never Used  . Alcohol Use: No   OB History    Gravida Para Term Preterm AB TAB SAB Ectopic Multiple Living   0 0 0 0 0 0 1     Review of Systems + sore throat + hoarse + cough No pleuritic pain No wheezing No nasal congestion ? post-nasal drainage No sinus pain/pressure No itchy/red eyes ? earache No hemoptysis No SOB No fever/chills + nausea No vomiting No abdominal pain No diarrhea No urinary symptoms No skin rash + fatigue + myalgias + headache Used OTC meds without relief  Allergies  Polytrim  Home Medications   Prior to Admission medications   Medication Sig Start Date End Date  Taking? Authorizing Provider  acetaminophen (TYLENOL) 500 MG tablet Take 500-1,000 mg by mouth every 6 (six) hours as needed. For pain or headache    Historical Provider, MD  amoxicillin (AMOXIL) 500 MG capsule Take 1 capsule (500 mg total) by mouth 3 (three) times daily. 07/25/14   Lattie Haw, MD  diphenhydrAMINE (BENADRYL) 25 mg capsule Take 25 mg by mouth every 6 (six) hours as needed. For itching    Historical Provider, MD  Docosahexaenoic Acid (DHA COMPLETE) 200 MG CAPS Take 1 capsule by mouth at bedtime.    Historical Provider, MD  ibuprofen (ADVIL,MOTRIN) 600 MG tablet Take 1 tablet (600 mg total) by mouth every 6 (six) hours. 09/29/11   Arlan Organ, CNM  ipratropium (ATROVENT) 0.06 % nasal spray Place 2 sprays into both nostrils 4 (four) times daily. 01/12/13   Hayden Rasmussen, NP  Prenatal Vit-Fe Fumarate-FA (PRENATAL MULTIVITAMIN) TABS Take 1 tablet by mouth at bedtime.    Historical Provider, MD  tobramycin-dexamethasone [facility]) ophthalmic solution Place 1 drop into both eyes every 4 (four) hours while awake. 06/13/14   Linna Hoff, MD   BP 114/68 mmHg  Pulse 76  Temp(Src) 98.4 F (36.9 C) (Oral)  Ht  (1.626 m)  Wt 173 lb (78.472 kg)  BMI  29.68 kg/m2  SpO2 98%  LMP 06/10/2014 (Exact Date) Physical Exam Nursing notes and Vital Signs reviewed. Appearance:  Patient appears stated age, and in no acute distress Eyes:  Pupils are equal, round, and reactive to light and accomodation.  Extraocular movement is intact.  Conjunctivae are not inflamed  Ears:  Canals normal.  Tympanic membranes normal.  Nose:  Mildly congested turbinates.  No sinus tenderness.    Pharynx:   Minimal erythema Neck:  Supple.   Tender enlarged posterior nodes are palpated bilaterally  Lungs:  Clear to auscultation.  Breath sounds are equal.  Moving air well. Heart:  Regular rate and rhythm without murmurs, rubs, or gallops.  Abdomen:  Nontender without masses or hepatosplenomegaly.  Bowel sounds are  present.  No CVA or flank tenderness.  Extremities:  No edema.  No calf tenderness Skin:  No rash present.   ED Course  Procedures  None    Labs Reviewed  STREP A DNA PROBE   Narrative:    Performed at:  [facility]                [address], [address]                [place], [place] [id number]  POCT RAPID STREP A (OFFICE) negative        MDM   1. Acute pharyngitis, unspecified pharyngitis type; suspect early viral URI    Throat culture pending May take guaifenesin for cough and congestion.  May take Tylenol for pain and fever. Try warm salt water gargles for sore throat.  Followup with Family Doctor if not improved in one week.     Lattie HawStephen A Kassim Guertin, MD 07/26/14 [phone]

## 2014-07-23 NOTE — Discharge Instructions (Signed)
May take guaifenesin for cough and congestion.  May take Tylenol for pain and fever. Try warm salt water gargles for sore throat.

## 2014-07-23 NOTE — ED Notes (Signed)
Sore throat x 3 days, body aches, chills, congestion

## 2014-07-24 LAB — STREP A DNA PROBE: GASP: NEGATIVE

## 2014-07-25 ENCOUNTER — Telehealth: Payer: Self-pay | Admitting: *Deleted

## 2014-07-25 MED ORDER — AMOXICILLIN 500 MG PO CAPS: 500.0000 mg | ORAL_CAPSULE | Freq: Three times a day (TID) | ORAL | Status: DC

## 2014-07-28 ENCOUNTER — Telehealth: Payer: Self-pay | Admitting: Emergency Medicine

## 2014-08-06 LAB — OB RESULTS CONSOLE ANTIBODY SCREEN: Antibody Screen: NEGATIVE

## 2014-08-06 LAB — OB RESULTS CONSOLE HEPATITIS B SURFACE ANTIGEN: Hepatitis B Surface Ag: NEGATIVE

## 2014-08-06 LAB — OB RESULTS CONSOLE ABO/RH: RH Type: POSITIVE

## 2014-08-06 LAB — OB RESULTS CONSOLE GC/CHLAMYDIA
Chlamydia: NEGATIVE
Gonorrhea: NEGATIVE

## 2014-08-06 LAB — OB RESULTS CONSOLE RUBELLA ANTIBODY, IGM: Rubella: IMMUNE

## 2014-08-06 LAB — OB RESULTS CONSOLE RPR: RPR: NONREACTIVE

## 2014-08-06 LAB — OB RESULTS CONSOLE HIV ANTIBODY (ROUTINE TESTING): HIV: NONREACTIVE

## 2015-01-13 DIAGNOSIS — Z3A31 31 weeks gestation of pregnancy: Secondary | ICD-10-CM | POA: Diagnosis not present

## 2015-01-13 DIAGNOSIS — O403XX0 Polyhydramnios, third trimester, not applicable or unspecified: Secondary | ICD-10-CM | POA: Diagnosis not present

## 2015-02-10 DIAGNOSIS — Z3A35 35 weeks gestation of pregnancy: Secondary | ICD-10-CM | POA: Diagnosis not present

## 2015-02-10 DIAGNOSIS — Z3483 Encounter for supervision of other normal pregnancy, third trimester: Secondary | ICD-10-CM | POA: Diagnosis not present

## 2015-02-10 DIAGNOSIS — Z36 Encounter for antenatal screening of mother: Secondary | ICD-10-CM | POA: Diagnosis not present

## 2015-02-10 DIAGNOSIS — O3663X0 Maternal care for excessive fetal growth, third trimester, not applicable or unspecified: Secondary | ICD-10-CM | POA: Diagnosis not present

## 2015-02-20 LAB — OB RESULTS CONSOLE GBS: GBS: NEGATIVE

## 2015-02-26 DIAGNOSIS — Z3A37 37 weeks gestation of pregnancy: Secondary | ICD-10-CM | POA: Diagnosis not present

## 2015-02-26 DIAGNOSIS — O3663X0 Maternal care for excessive fetal growth, third trimester, not applicable or unspecified: Secondary | ICD-10-CM | POA: Diagnosis not present

## 2015-02-27 ENCOUNTER — Other Ambulatory Visit: Payer: Self-pay | Admitting: Obstetrics and Gynecology

## 2015-03-03 ENCOUNTER — Encounter (HOSPITAL_COMMUNITY): Payer: Self-pay | Admitting: *Deleted

## 2015-03-03 ENCOUNTER — Telehealth (HOSPITAL_COMMUNITY): Payer: Self-pay | Admitting: *Deleted

## 2015-03-03 NOTE — Telephone Encounter (Signed)
Preadmission screen  

## 2015-03-05 DIAGNOSIS — Z3A37 37 weeks gestation of pregnancy: Secondary | ICD-10-CM | POA: Diagnosis not present

## 2015-03-05 DIAGNOSIS — O3663X0 Maternal care for excessive fetal growth, third trimester, not applicable or unspecified: Secondary | ICD-10-CM | POA: Diagnosis not present

## 2015-03-11 ENCOUNTER — Other Ambulatory Visit: Payer: Self-pay | Admitting: Obstetrics and Gynecology

## 2015-03-11 NOTE — H&P (Signed)
Janet Holmes is a 30 y.o. female presenting for IOL for polyhydramnios.  Maternal Medical History:  Reason for admission: Contractions.   Contractions: Onset was less than 1 hour ago.   Perceived severity is mild.    Fetal activity: Perceived fetal activity is normal.   Last perceived fetal movement was within the past hour.    Prenatal complications: Polyhydramnios.   Prenatal Complications - Diabetes: none.    OB History    Gravida Para Term Preterm AB TAB SAB Ectopic Multiple Living   0 0 0 0 0 0 1     Past Medical History  Diagnosis Date  . Clomid pregnancy   . Anemia   . Condyloma acuminatum   . Polyhydramnios, antepartum complication   . Polycystic ovaries   . H/O varicella   . Polyhydramnios in third trimester for IOL 09/27/2011  . Postpartum care following vaginal delivery (9/17, 2nd deg perineal lac) 09/28/2011   Past Surgical History  Procedure Laterality Date  . Wisdom tooth extraction    . Mole removal    . Knee arthroscopy      R   Family History: family history includes COPD in her father; Cancer in her maternal grandmother, mother, and paternal grandmother; Depression in her father; Diabetes in her paternal grandfather; Hypertension in her maternal grandfather, paternal grandfather, and paternal grandmother; Other in her sister; Rheum arthritis in her brother; Stroke in her maternal grandfather, paternal grandfather, and paternal grandmother; Varicose Veins in her mother. Social History:  reports that she has never smoked. She has never used smokeless tobacco. She reports that she does not drink alcohol or use illicit drugs.   Prenatal Transfer Tool  Maternal Diabetes: No Genetic Screening: Normal Maternal Ultrasounds/Referrals: Normal Fetal Ultrasounds or other Referrals:  None Maternal Substance Abuse:  No Significant Maternal Medications:  None Significant Maternal Lab Results:  None Other Comments:  None  Review of Systems   Constitutional: Negative.   All other systems reviewed and are negative.     Last menstrual period 06/10/2014, currently breastfeeding. Maternal Exam:  Uterine Assessment: Contraction strength is mild.  Contraction frequency is rare.   Abdomen: Patient reports no abdominal tenderness. Fetal presentation: vertex  Introitus: Normal vulva. Normal vagina.  Ferning test: not done.  Nitrazine test: not done. Amniotic fluid character: not assessed.  Pelvis: adequate for delivery.   Cervix: Cervix evaluated by digital exam.     Physical Exam  Nursing note and vitals reviewed. Constitutional: She appears well-developed and well-nourished.  HENT:  Head: Normocephalic and atraumatic.  Neck: Normal range of motion. Neck supple.  Cardiovascular: Normal rate and regular rhythm.   Respiratory: Effort normal and breath sounds normal.  GI: Bowel sounds are normal.  Genitourinary: Vagina normal and uterus normal.  Musculoskeletal: Normal range of motion.  Skin: Skin is warm and dry.  Psychiatric: She has a normal mood and affect.    Prenatal labs: ABO, Rh: A/Positive/-- (07/27 0000) Antibody: Negative (07/27 0000) Rubella: Immune (07/27 0000) RPR: Nonreactive (07/27 0000)  HBsAg: Negative (07/27 0000)  HIV: Non-reactive (07/27 0000)  GBS: Negative (02/10 0000)   Assessment/Plan: 39 wk IUP Moderate polyhydramnios EFW 4500gm- declines csection Cautious approach to IOL Risks of Shoulder dystocia discussed. T&S   Savyon Loken J 03/11/2015, 9:58 PM    IOL

## 2015-03-12 ENCOUNTER — Inpatient Hospital Stay (HOSPITAL_COMMUNITY): Payer: 59 | Admitting: Anesthesiology

## 2015-03-12 ENCOUNTER — Encounter (HOSPITAL_COMMUNITY)
Admission: RE | Disposition: A | Discharge status: Home / Self Care | Payer: Self-pay | Source: Ambulatory Visit | Attending: Obstetrics and Gynecology

## 2015-03-12 ENCOUNTER — Inpatient Hospital Stay (HOSPITAL_COMMUNITY)
Admission: RE | Admit: 2015-03-12 | Discharge: 2015-03-14 | DRG: 766 | Disposition: A | Discharge status: Home / Self Care | Payer: 59 | Source: Ambulatory Visit | Attending: Obstetrics and Gynecology | Admitting: Obstetrics and Gynecology

## 2015-03-12 ENCOUNTER — Encounter (HOSPITAL_COMMUNITY): Payer: Self-pay

## 2015-03-12 DIAGNOSIS — O3663X0 Maternal care for excessive fetal growth, third trimester, not applicable or unspecified: Secondary | ICD-10-CM | POA: Diagnosis present

## 2015-03-12 DIAGNOSIS — O403XX0 Polyhydramnios, third trimester, not applicable or unspecified: Secondary | ICD-10-CM | POA: Diagnosis present

## 2015-03-12 DIAGNOSIS — Z3A39 39 weeks gestation of pregnancy: Secondary | ICD-10-CM

## 2015-03-12 DIAGNOSIS — O409XX0 Polyhydramnios, unspecified trimester, not applicable or unspecified: Secondary | ICD-10-CM | POA: Diagnosis present

## 2015-03-12 LAB — CBC
HCT: 33.6 % — ABNORMAL LOW (ref 36.0–46.0)
Hemoglobin: 11.3 g/dL — ABNORMAL LOW (ref 12.0–15.0)
MCH: 29.8 pg (ref 26.0–34.0)
MCHC: 33.6 g/dL (ref 30.0–36.0)
MCV: 88.7 fL (ref 78.0–100.0)
Platelets: 216 10*3/uL (ref 150–400)
RBC: 3.79 MIL/uL — ABNORMAL LOW (ref 3.87–5.11)
RDW: 12.9 % (ref 11.5–15.5)
WBC: 10.6 10*3/uL — ABNORMAL HIGH (ref 4.0–10.5)

## 2015-03-12 LAB — RPR: RPR Ser Ql: NONREACTIVE

## 2015-03-12 LAB — TYPE AND SCREEN
ABO/RH(D): A POS
Antibody Screen: NEGATIVE

## 2015-03-12 SURGERY — Surgical Case
Anesthesia: Epidural | Site: Abdomen

## 2015-03-12 MED ORDER — DIPHENHYDRAMINE HCL 50 MG/ML IJ SOLN: 12.5000 mg | INTRAMUSCULAR | Status: DC | PRN

## 2015-03-12 MED ORDER — ZOLPIDEM TARTRATE 5 MG PO TABS: 5.0000 mg | ORAL_TABLET | Freq: Every evening | ORAL | Status: DC | PRN

## 2015-03-12 MED ORDER — KETOROLAC TROMETHAMINE 30 MG/ML IJ SOLN
30.0000 mg | Freq: Four times a day (QID) | INTRAMUSCULAR | Status: AC | PRN
  Administered 2015-03-13: 30 mg via INTRAVENOUS
  Filled 2015-03-12: qty 1

## 2015-03-12 MED ORDER — DIBUCAINE 1 % RE OINT: 1.0000 "application " | TOPICAL_OINTMENT | RECTAL | Status: DC | PRN

## 2015-03-12 MED ORDER — NALOXONE HCL 0.4 MG/ML IJ SOLN: 0.4000 mg | INTRAMUSCULAR | Status: DC | PRN

## 2015-03-12 MED ORDER — OXYTOCIN 10 UNIT/ML IJ SOLN
INTRAMUSCULAR | Status: AC
  Filled 2015-03-12: qty 4

## 2015-03-12 MED ORDER — ACETAMINOPHEN 325 MG PO TABS: 650.0000 mg | ORAL_TABLET | ORAL | Status: DC | PRN

## 2015-03-12 MED ORDER — MEPERIDINE HCL 25 MG/ML IJ SOLN: 6.2500 mg | INTRAMUSCULAR | Status: DC | PRN

## 2015-03-12 MED ORDER — MISOPROSTOL 25 MCG QUARTER TABLET
25.0000 ug | ORAL_TABLET | ORAL | Status: DC | PRN
  Administered 2015-03-12 (×2): 25 ug via VAGINAL
  Filled 2015-03-12 (×2): qty 0.25

## 2015-03-12 MED ORDER — NALBUPHINE HCL 10 MG/ML IJ SOLN: 5.0000 mg | Freq: Once | INTRAMUSCULAR | Status: AC | PRN

## 2015-03-12 MED ORDER — DEXAMETHASONE SODIUM PHOSPHATE 4 MG/ML IJ SOLN
INTRAMUSCULAR | Status: AC
  Filled 2015-03-12: qty 1

## 2015-03-12 MED ORDER — SODIUM CHLORIDE 0.9% FLUSH: 3.0000 mL | INTRAVENOUS | Status: DC | PRN

## 2015-03-12 MED ORDER — OXYTOCIN 10 UNIT/ML IJ SOLN: 2.5000 [IU]/h | INTRAVENOUS | Status: AC

## 2015-03-12 MED ORDER — LIDOCAINE HCL (PF) 1 % IJ SOLN
30.0000 mL | INTRAMUSCULAR | Status: DC | PRN
  Filled 2015-03-12: qty 30

## 2015-03-12 MED ORDER — OXYTOCIN BOLUS FROM INFUSION: 500.0000 mL | INTRAVENOUS | Status: DC

## 2015-03-12 MED ORDER — TETANUS-DIPHTH-ACELL PERTUSSIS 5-2.5-18.5 LF-MCG/0.5 IM SUSP: 0.5000 mL | Freq: Once | INTRAMUSCULAR | Status: DC

## 2015-03-12 MED ORDER — SIMETHICONE 80 MG PO CHEW
80.0000 mg | CHEWABLE_TABLET | ORAL | Status: DC
  Administered 2015-03-13: 80 mg via ORAL
  Filled 2015-03-12: qty 1

## 2015-03-12 MED ORDER — DIPHENHYDRAMINE HCL 25 MG PO CAPS
25.0000 mg | ORAL_CAPSULE | Freq: Four times a day (QID) | ORAL | Status: DC | PRN
  Filled 2015-03-12: qty 1

## 2015-03-12 MED ORDER — TERBUTALINE SULFATE 1 MG/ML IJ SOLN: 0.2500 mg | Freq: Once | INTRAMUSCULAR | Status: DC | PRN

## 2015-03-12 MED ORDER — LANOLIN HYDROUS EX OINT: 1.0000 "application " | TOPICAL_OINTMENT | CUTANEOUS | Status: DC | PRN

## 2015-03-12 MED ORDER — CEFAZOLIN SODIUM-DEXTROSE 2-3 GM-% IV SOLR
INTRAVENOUS | Status: DC | PRN
  Administered 2015-03-12: 2 g via INTRAVENOUS

## 2015-03-12 MED ORDER — MENTHOL 3 MG MT LOZG: 1.0000 | LOZENGE | OROMUCOSAL | Status: DC | PRN

## 2015-03-12 MED ORDER — NALBUPHINE HCL 10 MG/ML IJ SOLN
5.0000 mg | INTRAMUSCULAR | Status: DC | PRN
  Administered 2015-03-13: 5 mg via INTRAVENOUS
  Filled 2015-03-12 (×2): qty 1

## 2015-03-12 MED ORDER — SIMETHICONE 80 MG PO CHEW
80.0000 mg | CHEWABLE_TABLET | Freq: Three times a day (TID) | ORAL | Status: DC
  Administered 2015-03-13 – 2015-03-14 (×5): 80 mg via ORAL
  Filled 2015-03-12 (×5): qty 1

## 2015-03-12 MED ORDER — FENTANYL 2.5 MCG/ML BUPIVACAINE 1/10 % EPIDURAL INFUSION (WH - ANES)
14.0000 mL/h | INTRAMUSCULAR | Status: DC | PRN
  Administered 2015-03-12: 14 mL/h via EPIDURAL
  Filled 2015-03-12: qty 125

## 2015-03-12 MED ORDER — METHYLERGONOVINE MALEATE 0.2 MG/ML IJ SOLN: 0.2000 mg | INTRAMUSCULAR | Status: DC | PRN

## 2015-03-12 MED ORDER — LACTATED RINGERS IV SOLN: 500.0000 mL | INTRAVENOUS | Status: DC | PRN

## 2015-03-12 MED ORDER — FENTANYL 2.5 MCG/ML BUPIVACAINE 1/10 % EPIDURAL INFUSION (WH - ANES)
INTRAMUSCULAR | Status: AC
  Administered 2015-03-12: 14 mL/h via EPIDURAL
  Filled 2015-03-12: qty 125

## 2015-03-12 MED ORDER — PHENYLEPHRINE 40 MCG/ML (10ML) SYRINGE FOR IV PUSH (FOR BLOOD PRESSURE SUPPORT): 80.0000 ug | PREFILLED_SYRINGE | INTRAVENOUS | Status: DC | PRN

## 2015-03-12 MED ORDER — BUPIVACAINE HCL (PF) 0.25 % IJ SOLN
INTRAMUSCULAR | Status: DC | PRN
  Administered 2015-03-12: 30 mL

## 2015-03-12 MED ORDER — OXYCODONE-ACETAMINOPHEN 5-325 MG PO TABS: 2.0000 | ORAL_TABLET | ORAL | Status: DC | PRN

## 2015-03-12 MED ORDER — PHENYLEPHRINE 40 MCG/ML (10ML) SYRINGE FOR IV PUSH (FOR BLOOD PRESSURE SUPPORT)
PREFILLED_SYRINGE | INTRAVENOUS | Status: AC
  Filled 2015-03-12: qty 20

## 2015-03-12 MED ORDER — WITCH HAZEL-GLYCERIN EX PADS: 1.0000 "application " | MEDICATED_PAD | CUTANEOUS | Status: DC | PRN

## 2015-03-12 MED ORDER — NALBUPHINE HCL 10 MG/ML IJ SOLN
5.0000 mg | Freq: Once | INTRAMUSCULAR | Status: AC | PRN
  Administered 2015-03-12: 5 mg via SUBCUTANEOUS

## 2015-03-12 MED ORDER — KETOROLAC TROMETHAMINE 30 MG/ML IJ SOLN: 30.0000 mg | Freq: Four times a day (QID) | INTRAMUSCULAR | Status: AC | PRN

## 2015-03-12 MED ORDER — CEFAZOLIN SODIUM-DEXTROSE 2-3 GM-% IV SOLR
INTRAVENOUS | Status: AC
  Filled 2015-03-12: qty 50

## 2015-03-12 MED ORDER — MORPHINE SULFATE (PF) 0.5 MG/ML IJ SOLN
INTRAMUSCULAR | Status: AC
  Filled 2015-03-12: qty 10

## 2015-03-12 MED ORDER — PHENYLEPHRINE HCL 10 MG/ML IJ SOLN
INTRAMUSCULAR | Status: DC | PRN
  Administered 2015-03-12 (×4): 80 ug via INTRAVENOUS
  Administered 2015-03-12: 40 ug via INTRAVENOUS
  Administered 2015-03-12: 80 ug via INTRAVENOUS

## 2015-03-12 MED ORDER — EPHEDRINE 5 MG/ML INJ: 10.0000 mg | INTRAVENOUS | Status: DC | PRN

## 2015-03-12 MED ORDER — ONDANSETRON HCL 4 MG/2ML IJ SOLN
INTRAMUSCULAR | Status: DC | PRN
  Administered 2015-03-12: 4 mg via INTRAVENOUS

## 2015-03-12 MED ORDER — FLEET ENEMA 7-19 GM/118ML RE ENEM: 1.0000 | ENEMA | RECTAL | Status: DC | PRN

## 2015-03-12 MED ORDER — SODIUM BICARBONATE 8.4 % IV SOLN
INTRAVENOUS | Status: DC | PRN
  Administered 2015-03-12 (×2): 5 mL via EPIDURAL

## 2015-03-12 MED ORDER — SCOPOLAMINE 1 MG/3DAYS TD PT72
MEDICATED_PATCH | TRANSDERMAL | Status: AC
Start: 2015-03-12 — End: 2015-03-12
  Filled 2015-03-12: qty 1

## 2015-03-12 MED ORDER — ONDANSETRON HCL 4 MG/2ML IJ SOLN: 4.0000 mg | Freq: Four times a day (QID) | INTRAMUSCULAR | Status: DC | PRN

## 2015-03-12 MED ORDER — DIPHENHYDRAMINE HCL 25 MG PO CAPS
25.0000 mg | ORAL_CAPSULE | ORAL | Status: DC | PRN
  Administered 2015-03-13 (×2): 25 mg via ORAL
  Filled 2015-03-12: qty 1

## 2015-03-12 MED ORDER — MEPERIDINE HCL 25 MG/ML IJ SOLN
INTRAMUSCULAR | Status: DC | PRN
  Administered 2015-03-12 (×2): 12.5 mg via INTRAVENOUS

## 2015-03-12 MED ORDER — LACTATED RINGERS IV SOLN
INTRAVENOUS | Status: DC
  Administered 2015-03-12 (×3): via INTRAVENOUS

## 2015-03-12 MED ORDER — NALBUPHINE HCL 10 MG/ML IJ SOLN
5.0000 mg | INTRAMUSCULAR | Status: DC | PRN
  Administered 2015-03-13 (×3): 5 mg via SUBCUTANEOUS
  Filled 2015-03-12 (×2): qty 1

## 2015-03-12 MED ORDER — PRENATAL MULTIVITAMIN CH
1.0000 | ORAL_TABLET | Freq: Every day | ORAL | Status: DC
  Administered 2015-03-13 – 2015-03-14 (×2): 1 via ORAL
  Filled 2015-03-12 (×2): qty 1

## 2015-03-12 MED ORDER — PHENYLEPHRINE 40 MCG/ML (10ML) SYRINGE FOR IV PUSH (FOR BLOOD PRESSURE SUPPORT)
PREFILLED_SYRINGE | INTRAVENOUS | Status: AC
  Filled 2015-03-12: qty 30

## 2015-03-12 MED ORDER — DEXAMETHASONE SODIUM PHOSPHATE 4 MG/ML IJ SOLN
INTRAMUSCULAR | Status: DC | PRN
  Administered 2015-03-12: 4 mg via INTRAVENOUS

## 2015-03-12 MED ORDER — OXYTOCIN 10 UNIT/ML IJ SOLN
2.5000 [IU]/h | INTRAVENOUS | Status: DC
  Filled 2015-03-12: qty 10

## 2015-03-12 MED ORDER — CITRIC ACID-SODIUM CITRATE 334-500 MG/5ML PO SOLN
30.0000 mL | ORAL | Status: DC | PRN
  Administered 2015-03-12: 30 mL via ORAL
  Filled 2015-03-12: qty 15

## 2015-03-12 MED ORDER — BUPIVACAINE HCL (PF) 0.25 % IJ SOLN
INTRAMUSCULAR | Status: AC
  Filled 2015-03-12: qty 30

## 2015-03-12 MED ORDER — SODIUM CHLORIDE 0.9 % IJ SOLN
INTRAMUSCULAR | Status: AC
  Filled 2015-03-12: qty 20

## 2015-03-12 MED ORDER — NALOXONE HCL 2 MG/2ML IJ SOSY
1.0000 ug/kg/h | PREFILLED_SYRINGE | INTRAVENOUS | Status: DC | PRN
  Filled 2015-03-12: qty 2

## 2015-03-12 MED ORDER — IBUPROFEN 600 MG PO TABS: 600.0000 mg | ORAL_TABLET | Freq: Four times a day (QID) | ORAL | Status: DC | PRN

## 2015-03-12 MED ORDER — OXYTOCIN 10 UNIT/ML IJ SOLN
40.0000 [IU] | INTRAVENOUS | Status: DC | PRN
  Administered 2015-03-12: 40 [IU] via INTRAVENOUS

## 2015-03-12 MED ORDER — SODIUM CHLORIDE 0.9 % IR SOLN
Status: DC | PRN
  Administered 2015-03-12: 1000 mL

## 2015-03-12 MED ORDER — SENNOSIDES-DOCUSATE SODIUM 8.6-50 MG PO TABS
2.0000 | ORAL_TABLET | ORAL | Status: DC
  Administered 2015-03-13: 2 via ORAL
  Filled 2015-03-12: qty 2

## 2015-03-12 MED ORDER — ONDANSETRON HCL 4 MG/2ML IJ SOLN
INTRAMUSCULAR | Status: AC
  Filled 2015-03-12: qty 2

## 2015-03-12 MED ORDER — METHYLERGONOVINE MALEATE 0.2 MG PO TABS: 0.2000 mg | ORAL_TABLET | ORAL | Status: DC | PRN

## 2015-03-12 MED ORDER — LACTATED RINGERS IV SOLN: 500.0000 mL | Freq: Once | INTRAVENOUS | Status: DC

## 2015-03-12 MED ORDER — LIDOCAINE HCL (PF) 1 % IJ SOLN
INTRAMUSCULAR | Status: DC | PRN
  Administered 2015-03-12 (×2): 5 mL

## 2015-03-12 MED ORDER — FENTANYL CITRATE (PF) 100 MCG/2ML IJ SOLN: 25.0000 ug | INTRAMUSCULAR | Status: DC | PRN

## 2015-03-12 MED ORDER — ONDANSETRON HCL 4 MG/2ML IJ SOLN: 4.0000 mg | Freq: Three times a day (TID) | INTRAMUSCULAR | Status: DC | PRN

## 2015-03-12 MED ORDER — OXYCODONE-ACETAMINOPHEN 5-325 MG PO TABS: 1.0000 | ORAL_TABLET | ORAL | Status: DC | PRN

## 2015-03-12 MED ORDER — OXYTOCIN 10 UNIT/ML IJ SOLN
1.0000 m[IU]/min | INTRAVENOUS | Status: DC
  Administered 2015-03-12: 2 m[IU]/min via INTRAVENOUS

## 2015-03-12 MED ORDER — IBUPROFEN 600 MG PO TABS
600.0000 mg | ORAL_TABLET | Freq: Four times a day (QID) | ORAL | Status: DC
  Administered 2015-03-13 – 2015-03-14 (×6): 600 mg via ORAL
  Filled 2015-03-12 (×6): qty 1

## 2015-03-12 MED ORDER — LACTATED RINGERS IV SOLN
INTRAVENOUS | Status: DC
  Administered 2015-03-13: 08:00:00 via INTRAVENOUS

## 2015-03-12 MED ORDER — MEPERIDINE HCL 25 MG/ML IJ SOLN
INTRAMUSCULAR | Status: AC
  Filled 2015-03-12: qty 1

## 2015-03-12 MED ORDER — FENTANYL CITRATE (PF) 100 MCG/2ML IJ SOLN: 50.0000 ug | INTRAMUSCULAR | Status: DC | PRN

## 2015-03-12 MED ORDER — SIMETHICONE 80 MG PO CHEW: 80.0000 mg | CHEWABLE_TABLET | ORAL | Status: DC | PRN

## 2015-03-12 MED ORDER — MORPHINE SULFATE (PF) 0.5 MG/ML IJ SOLN
INTRAMUSCULAR | Status: DC | PRN
  Administered 2015-03-12: 4 mg via EPIDURAL

## 2015-03-12 SURGICAL SUPPLY — 40 items
APL SKNCLS STERI-STRIP NONHPOA (GAUZE/BANDAGES/DRESSINGS) ×1
BENZOIN TINCTURE PRP APPL 2/3 (GAUZE/BANDAGES/DRESSINGS) ×1 IMPLANT
CLAMP CORD UMBIL (MISCELLANEOUS) IMPLANT
CLOTH BEACON ORANGE TIMEOUT ST (SAFETY) ×2 IMPLANT
CONTAINER PREFILL 10% NBF 15ML (MISCELLANEOUS) IMPLANT
DRSG OPSITE POSTOP 4X10 (GAUZE/BANDAGES/DRESSINGS) ×2 IMPLANT
DURAPREP 26ML APPLICATOR (WOUND CARE) ×3 IMPLANT
ELECT REM PT RETURN 9FT ADLT (ELECTROSURGICAL) ×2
ELECTRODE REM PT RTRN 9FT ADLT (ELECTROSURGICAL) ×1 IMPLANT
EXTRACTOR VACUUM M CUP 4 TUBE (SUCTIONS) IMPLANT
GLOVE BIO SURGEON STRL SZ7.5 (GLOVE) ×2 IMPLANT
GLOVE BIOGEL PI IND STRL 7.0 (GLOVE) ×1 IMPLANT
GLOVE BIOGEL PI INDICATOR 7.0 (GLOVE) ×3
GLOVE SURG SS PI 7.0 STRL IVOR (GLOVE) ×1 IMPLANT
GOWN STRL REUS W/TWL LRG LVL3 (GOWN DISPOSABLE) ×5 IMPLANT
KIT ABG SYR 3ML LUER SLIP (SYRINGE) IMPLANT
NDL HYPO 25X5/8 SAFETYGLIDE (NEEDLE) IMPLANT
NDL SPNL 20GX3.5 QUINCKE YW (NEEDLE) IMPLANT
NEEDLE HYPO 22GX1.5 SAFETY (NEEDLE) ×2 IMPLANT
NEEDLE HYPO 25X5/8 SAFETYGLIDE (NEEDLE) IMPLANT
NEEDLE SPNL 20GX3.5 QUINCKE YW (NEEDLE) IMPLANT
NS IRRIG 1000ML POUR BTL (IV SOLUTION) ×2 IMPLANT
PACK C SECTION WH (CUSTOM PROCEDURE TRAY) ×2 IMPLANT
PAD ABD 7.5X8 STRL (GAUZE/BANDAGES/DRESSINGS) ×1 IMPLANT
PENCIL SMOKE EVAC W/HOLSTER (ELECTROSURGICAL) ×2 IMPLANT
SPONGE GAUZE 4X4 12PLY STER LF (GAUZE/BANDAGES/DRESSINGS) ×1 IMPLANT
STRIP CLOSURE SKIN 1/2X4 (GAUZE/BANDAGES/DRESSINGS) ×1 IMPLANT
SUT MNCRL 0 VIOLET CTX 36 (SUTURE) ×2 IMPLANT
SUT MNCRL AB 3-0 PS2 27 (SUTURE) ×1 IMPLANT
SUT MON AB 2-0 CT1 27 (SUTURE) ×2 IMPLANT
SUT MON AB-0 CT1 36 (SUTURE) ×4 IMPLANT
SUT MONOCRYL 0 CTX 36 (SUTURE) ×2
SUT PLAIN 0 NONE (SUTURE) IMPLANT
SUT PLAIN 2 0 (SUTURE)
SUT PLAIN 2 0 XLH (SUTURE) ×1 IMPLANT
SUT PLAIN ABS 2-0 CT1 27XMFL (SUTURE) IMPLANT
SYR 20CC LL (SYRINGE) IMPLANT
SYR CONTROL 10ML LL (SYRINGE) ×2 IMPLANT
TOWEL OR 17X24 6PK STRL BLUE (TOWEL DISPOSABLE) ×2 IMPLANT
TRAY FOLEY CATH SILVER 14FR (SET/KITS/TRAYS/PACK) ×2 IMPLANT

## 2015-03-12 NOTE — Consult Note (Signed)
Neonatology Note:   Attendance at C-section:    I was asked by Dr. Billy Coast to attend this C/S at term for FTP and macrosomia with h/p polyhydrmanios. The mother is a G2P1, GBS neg. ROM 13h before delivery, fluid clear. Infant vigorous with good spontaneous cry and fair tone. Needed only minimal bulb suctioning. Ap 7/9. Lungs clear to ausc in DR. To CN to care of Pediatrician.   Jamie Brookes, MD

## 2015-03-12 NOTE — Anesthesia Preprocedure Evaluation (Addendum)
Anesthesia Evaluation  Patient identified by MRN, date of birth, ID band Patient awake    Reviewed: Allergy & Precautions, H&P , NPO status , Patient's Chart, lab work & pertinent test results  History of Anesthesia Complications Negative for: history of anesthetic complications  Airway Mallampati: II  TM Distance: >3 FB Neck ROM: full    Dental no notable dental hx. (+) Teeth Intact   Pulmonary neg pulmonary ROS,    Pulmonary exam normal breath sounds clear to auscultation       Cardiovascular negative cardio ROS Normal cardiovascular exam Rhythm:regular Rate:Normal     Neuro/Psych negative neurological ROS  negative psych ROS   GI/Hepatic negative GI ROS, Neg liver ROS,   Endo/Other  negative endocrine ROS  Renal/GU negative Renal ROS  negative genitourinary   Musculoskeletal   Abdominal   Peds  Hematology negative hematology ROS (+)   Anesthesia Other Findings   Reproductive/Obstetrics (+) Pregnancy                             Anesthesia Physical Anesthesia Plan  ASA: III and emergent  Anesthesia Plan: Epidural   Post-op Pain Management:    Induction:   Airway Management Planned: Natural Airway  Additional Equipment:   Intra-op Plan:   Post-operative Plan:   Informed Consent: I have reviewed the patients History and Physical, chart, labs and discussed the procedure including the risks, benefits and alternatives for the proposed anesthesia with the patient or authorized representative who has indicated his/her understanding and acceptance.     Plan Discussed with: Anesthesiologist, CRNA and Surgeon  Anesthesia Plan Comments: (Patient for C/section for arrest of descent and fetal macrosomia. Will use epidural for C/Section. M. Malen Gauze, MD )     Anesthesia Quick Evaluation

## 2015-03-12 NOTE — Progress Notes (Signed)
Janet Holmes is a 30 y.o. G2P1001 at [redacted]w[redacted]d by LMP admitted for induction of labor due to Hydramnios and LGA.  Subjective: Occ pressure  Objective: BP 134/73 mmHg  Pulse 81  Temp(Src) 98.5 F (36.9 C) (Oral)  Resp 20  Ht  (1.651 m)  Wt 108.863 kg (240 lb)  BMI 39.94 kg/m2  SpO2 99%  LMP 06/10/2014      FHT:  FHR: 145 bpm, variability: moderate,  accelerations:  Present,  decelerations:  Present occ variable UC:   regular, every 2 minutes SVE:   Dilation: Lip/rim Effacement (%): 90 Station: -1 Exam by:: Hagop Mccollam  Originally reducible lip was reduced and pt pushed through but now lip has returned. Cervix with notable swelling at 2100, LOA- LOT  Labs: Lab Results  Component Value Date   WBC 10.6* 03/12/2015   HGB 11.3* 03/12/2015   HCT 33.6* 03/12/2015   MCV 88.7 03/12/2015   PLT 216 03/12/2015    Assessment / Plan: Protracted active phase  EFW 4500gm  Labor: No change since 1740 Preeclampsia:  no signs or symptoms of toxicity Fetal Wellbeing:  Category I and Category II Pain Control:  Epidural I/D:  n/a Anticipated MOD:  guarded- discussed with pt and husband. Will reevaluate in .  Akoni Parton J 03/12/2015, 7:10 PM

## 2015-03-12 NOTE — Op Note (Signed)
Cesarean Section Procedure Note  Indications: failure to progress: arrest of dilation  Pre-operative Diagnosis: 39 week 2 day pregnancy.  Post-operative Diagnosis: same  Surgeon: Lenoard Aden   Assistants: Montez Hageman, CNM  Anesthesia: Epidural anesthesia  ASA Class: 2  Procedure Details  The patient was seen in the Holding Room. The risks, benefits, complications, treatment options, and expected outcomes were discussed with the patient.  The patient concurred with the proposed plan, giving informed consent. The risks of anesthesia, infection, bleeding and possible injury to other organs discussed. Injury to bowel, bladder, or ureter with possible need for repair discussed. Possible need for transfusion with secondary risks of hepatitis or HIV acquisition discussed. Post operative complications to include but not limited to DVT, PE and Pneumonia noted. The site of surgery properly noted/marked. The patient was taken to Operating Room # 9, identified as Janet Holmes and the procedure verified as C-Section Delivery. A Time Out was held and the above information confirmed.  After induction of anesthesia, the patient was draped and prepped in the usual sterile manner. A Pfannenstiel incision was made and carried down through the subcutaneous tissue to the fascia. Fascial incision was made and extended transversely using Mayo scissors. The fascia was separated from the underlying rectus tissue superiorly and inferiorly. The peritoneum was identified and entered. Peritoneal incision was extended longitudinally. The utero-vesical peritoneal reflection was incised transversely and the bladder flap was bluntly freed from the lower uterine segment. A low transverse uterine incision(Kerr hysterotomy) was made. Delivered from LOT presentation was a  female with Apgar scores of 8 at one minute and 9 at five minutes. Bulb suctioning gently performed. Neonatal team in attendance.After the umbilical cord  was clamped and cut cord blood was obtained for evaluation. The placenta was removed intact and appeared normal. The uterus was curetted with a dry lap pack. Good hemostasis was noted.The uterine outline, tubes and ovaries appeared normal. The uterine incision was closed with running locked sutures of 0 Monocryl x 2 layers. Hemostasis was observed. Lavage was carried out until clear.The parietal peritoneum was closed with a running 2-0 Monocryl suture. The fascia was then reapproximated with running sutures of 0 Monocryl. The skin was reapproximated with 3-0 monocryl after [place] closure with 2-0 plain.  Instrument, sponge, and needle counts were correct prior the abdominal closure and at the conclusion of the case.   Findings: FTLM, LOT  Estimated Blood Loss:  300 mL         Drains: foley                 Specimens: placenta                 Complications:  None; patient tolerated the procedure well.         Disposition: PACU - hemodynamically stable.         Condition: stable  Attending Attestation: I performed the procedure.

## 2015-03-12 NOTE — Anesthesia Procedure Notes (Signed)
Epidural Patient location during procedure: OB  Staffing Anesthesiologist: Phillips Grout Performed by: anesthesiologist   Preanesthetic Checklist Completed: patient identified, site marked, surgical consent, pre-op evaluation, timeout performed, IV checked, risks and benefits discussed and monitors and equipment checked  Epidural Patient position: sitting Prep: DuraPrep Patient monitoring: heart rate, continuous pulse ox and blood pressure Approach: right paramedian Location: L2-L3 Injection technique: LOR saline  Needle:  Needle type: Tuohy  Needle gauge: 17 G Needle length: 9 cm and 9 Needle insertion depth: 5 cm Catheter type: closed end flexible Catheter size: 20 Guage Catheter at skin depth: 10 cm Test dose: negative  Assessment Events: blood not aspirated, injection not painful, no injection resistance, negative IV test and no paresthesia  Additional Notes Patient identified. Risks/Benefits/Options discussed with patient including but not limited to bleeding, infection, nerve damage, paralysis, failed block, incomplete pain control, headache, blood pressure changes, nausea, vomiting, reactions to medication both or allergic, itching and postpartum back pain. Confirmed with bedside nurse the patient's most recent platelet count. Confirmed with patient that they are not currently taking any anticoagulation, have any bleeding history or any family history of bleeding disorders. Patient expressed understanding and wished to proceed. All questions were answered. Sterile technique was used throughout the entire procedure. Please see nursing notes for vital signs. Test dose was given through epidural needle and negative prior to continuing to dose epidural or start infusion. Warning signs of high block given to the patient including shortness of breath, tingling/numbness in hands, complete motor block, or any concerning symptoms with instructions to call for help. Patient was given  instructions on fall risk and not to get out of bed. All questions and concerns addressed with instructions to call with any issues.

## 2015-03-12 NOTE — Progress Notes (Signed)
Patient on birthing ball, contracting every 1.5-2.5 minutes. RN and patient had discussion regarding patient's wishes as well as contraction pattern and decided to wait to begin pitocin. Patient would rather not have it if it is not needed. Will continue to monitor closely for contraction spacing as well as fetal heart tones. Vernette Moise [place], [place] 03/12/2015 0900

## 2015-03-12 NOTE — Progress Notes (Signed)
MD called RN for update. RN notified MD that pitocin had been started, patient feeling contractions/pain but breathing well through them and not requesting pain management. In fact, wished NOT to have pain meds or epidural. MD advises against this and requests RN to educate patient on risks. RN will educate patient regarding risks of a probable large baby with no anesthesia. MD to continue education upon his arrival. Loetta Rough, [place] 03/12/2015 11:22 AM

## 2015-03-12 NOTE — Progress Notes (Signed)
Janet Holmes is a 30 y.o. G2P1001 at [redacted]w[redacted]d by LMP admitted for induction of labor due to Hydramnios.  Subjective: comfortable  Objective: BP 135/70 mmHg  Pulse 80  Temp(Src) 98.4 F (36.9 C) (Oral)  Resp 18  Ht  (1.651 m)  Wt 108.863 kg (240 lb)  BMI 39.94 kg/m2  LMP 06/10/2014      FHT:  FHR: 145 bpm, variability: moderate,  accelerations:  Present,  decelerations:  Absent UC:   regular, every 2-3 minutes SVE:   Dilation: 3.5 Effacement (%): 60 Station: -2 Exam by:: Saraih Lorton  Labs: Lab Results  Component Value Date   WBC 10.6* 03/12/2015   HGB 11.3* 03/12/2015   HCT 33.6* 03/12/2015   MCV 88.7 03/12/2015   PLT 216 03/12/2015    Assessment / Plan: Induction of labor due to polyhydramnios,  progressing well on pitocin  Labor: slow progress Preeclampsia:  no signs or symptoms of toxicity Fetal Wellbeing:  Category I Pain Control:  Labor support without medications I/D:  n/a Anticipated MOD:  guarded. Recommend epidural with risks of SD due to EFW 4500gm  Brycin Kille J 03/12/2015, 1:01 PM

## 2015-03-12 NOTE — Progress Notes (Signed)
Patient did not wish to begin Pitocin at this point, but RN assessed cervical change and it was minimal. Called MD to update. MD wished to proceed with Pitocin due to lack of cervical change. Patient educated and reluctantly in agreement. Breathing well through contractions, but feeling pain. Does not wish to have pain med or epidural at this time. Anny Sayler [place], [place] 03/12/2015 1100

## 2015-03-12 NOTE — Progress Notes (Signed)
Per MD request, educated patient regarding epidural vs not receiving one. Patient very educated already and is aware of risks vs benefits. She stated she has been thinking about an epidural also, and is debating receiving one. She requests to not increase Pitocin at this time, and would like more time to think about getting the epidural. Will continue to monitor and assess changes as necessary. Laurren Lepkowski [place], [place] 03/12/2015 1145

## 2015-03-12 NOTE — Transfer of Care (Signed)
Immediate Anesthesia Transfer of Care Note  Patient: Janet Holmes  Procedure(s) Performed: Procedure(s): CESAREAN SECTION (N/A)  Patient Location: PACU  Anesthesia Type:Epidural  Level of Consciousness: awake, alert , oriented and patient cooperative  Airway & Oxygen Therapy: Patient Spontanous Breathing  Post-op Assessment: Report given to RN and Post -op Vital signs reviewed and stable  Post vital signs: Reviewed and stable  Last Vitals:  Filed Vitals:   03/12/15 1903 03/12/15 1931  BP: 134/73 133/52  Pulse: 81 80  Temp: 36.9 C   Resp: 20 20    Complications: No apparent anesthesia complications

## 2015-03-12 NOTE — Anesthesia Postprocedure Evaluation (Signed)
Anesthesia Post Note  Patient: ALYSSAMAE KLINCK  Procedure(s) Performed: Procedure(s) (LRB): CESAREAN SECTION (N/A)  Patient location during evaluation: PACU Anesthesia Type: Epidural Level of consciousness: awake and alert and oriented Pain management: pain level controlled Vital Signs Assessment: post-procedure vital signs reviewed and stable Respiratory status: spontaneous breathing, nonlabored ventilation and respiratory function stable Cardiovascular status: blood pressure returned to baseline and stable Postop Assessment: no signs of nausea or vomiting, no headache, no backache, epidural receding and patient able to bend at knees Anesthetic complications: no    Last Vitals:  Filed Vitals:   03/12/15 2145 03/12/15 2200  BP: 124/62 93/79  Pulse: 83 65  Temp:    Resp: 22 20    Last Pain:  Filed Vitals:   03/12/15 2212  PainSc: 2                  Kaiden Pech A.

## 2015-03-12 NOTE — Progress Notes (Signed)
IVORI STORR is a 30 y.o. G2P1001 at [redacted]w[redacted]d by LMP admitted for induction of labor due to Hydramnios.  Subjective: comfortable  Objective: BP 118/60 mmHg  Pulse 76  Temp(Src) 98 F (36.7 C) (Oral)  Resp 20  Ht  (1.651 m)  Wt 108.863 kg (240 lb)  BMI 39.94 kg/m2  LMP 06/10/2014      FHT:  FHR: 155 bpm, variability: moderate,  accelerations:  Present,  decelerations:  Absent UC:   irregular, every 2-6 minutes SVE:   2/60/-2 AROM- clear , copious  Labs: Lab Results  Component Value Date   WBC 10.6* 03/12/2015   HGB 11.3* 03/12/2015   HCT 33.6* 03/12/2015   MCV 88.7 03/12/2015   PLT 216 03/12/2015    Assessment / Plan: Induction of labor due to polyhydramnios,  progressing well on pitocin  Labor: Progressing normally Preeclampsia:  no signs or symptoms of toxicity Fetal Wellbeing:  Category I Pain Control:  Labor support without medications I/D:  n/a Anticipated MOD:  guarded  Atisha Hamidi J 03/12/2015, 6:18 AM

## 2015-03-13 ENCOUNTER — Encounter (HOSPITAL_COMMUNITY): Payer: Self-pay | Admitting: Obstetrics and Gynecology

## 2015-03-13 LAB — CBC
HCT: 33.3 % — ABNORMAL LOW (ref 36.0–46.0)
Hemoglobin: 11 g/dL — ABNORMAL LOW (ref 12.0–15.0)
MCH: 29.3 pg (ref 26.0–34.0)
MCHC: 33 g/dL (ref 30.0–36.0)
MCV: 88.6 fL (ref 78.0–100.0)
Platelets: 202 10*3/uL (ref 150–400)
RBC: 3.76 MIL/uL — ABNORMAL LOW (ref 3.87–5.11)
RDW: 13 % (ref 11.5–15.5)
WBC: 18.2 10*3/uL — ABNORMAL HIGH (ref 4.0–10.5)

## 2015-03-13 MED ORDER — OXYCODONE-ACETAMINOPHEN 5-325 MG PO TABS
1.0000 | ORAL_TABLET | ORAL | Status: DC | PRN
Start: 2015-03-13 — End: 2015-03-14
  Administered 2015-03-13 – 2015-03-14 (×4): 1 via ORAL
  Administered 2015-03-14: 2 via ORAL
  Filled 2015-03-13 (×2): qty 1
  Filled 2015-03-13: qty 2
  Filled 2015-03-13 (×2): qty 1

## 2015-03-13 NOTE — Progress Notes (Signed)
Notified Bhambri CMW patient requesting something for pain, and has felt a "pop" at her incision. Appears wnl.

## 2015-03-13 NOTE — Anesthesia Postprocedure Evaluation (Signed)
Anesthesia Post Note  Patient: Janet Holmes  Procedure(s) Performed: Procedure(s) (LRB): CESAREAN SECTION (N/A)  Patient location during evaluation: Mother Baby Anesthesia Type: Epidural Level of consciousness: awake Pain management: satisfactory to patient (pain score 0) Vital Signs Assessment: post-procedure vital signs reviewed and stable Respiratory status: spontaneous breathing Cardiovascular status: stable Postop Assessment: no headache, no backache, epidural receding and patient able to bend at knees Anesthetic complications: no    Last Vitals:  Filed Vitals:   03/13/15 0240 03/13/15 0630  BP: 121/64 112/46  Pulse: 104 62  Temp:  36.6 C  Resp:  20    Last Pain:  Filed Vitals:   03/13/15 0729  PainSc: 0-No pain                 Janet Holmes,Janet Holmes

## 2015-03-13 NOTE — Addendum Note (Signed)
Addendum  created 03/13/15 0857 by Earmon PhoenixValerie P Kemari Narez, CRNA   Modules edited: Clinical Notes   Clinical Notes:  File: [id number]

## 2015-03-13 NOTE — Lactation Note (Signed)
This note was copied from a baby's chart. Lactation Consultation Note Experienced BF mom BF her now 162 1/31 yr old daughter for 16 months. States this 10lb baby is latching and BF well. Denies soreness at this time. States everything is going well w/BF. Mom denies painful sucks. FOB at bedside. Reminded of I&O, STS, and . Reminded of feeding habits. Mom encouraged to feed baby 8-12 times/24 hours and with feeding cues. Reviewed Baby & Me book's Breastfeeding Basics. Encouraged to call for assistance if needed and to verify proper latch.[facility] brochure given w/resources, support groups and LC services.  Patient Name: Boy Rexene AlbertsStephanie Quilter ZOXWR'UToday's Date: 03/13/2015 Reason for consult: Initial assessment   Maternal Data Has patient been taught Hand Expression?: Yes Does the patient have breastfeeding experience prior to this delivery?: Yes  Feeding Feeding Type: Breast Fed Length of feed: 30 min  LATCH Score/Interventions                      Lactation Tools Discussed/Used WIC Program: No   Consult Status Consult Status: Follow-up Date: 03/14/15 Follow-up type: In-patient    Charyl DancerCARVER, Tamela Elsayed G 03/13/2015, 3:49 AM

## 2015-03-13 NOTE — Progress Notes (Signed)
POD # 1  Subjective: Pt reports feeling well/ Pain controlled with Motrin and long acting spinal narcotic Tolerating po/ Foley in place No n/v/ Flatus absent Activity: up with assistance/ sitting up in chair Bleeding is light Newborn info:  Information for the patient's newborn:  Vonna KotykDixon, Boy Shellyann [[id number]][[id number]]  female   Circumcision: planning/ Feeding: breast  Objective:  VS:  Filed Vitals:   03/13/15 0240 03/13/15 0630 03/13/15 1100 03/13/15 1201  BP: 121/64 112/46 118/60   Pulse: 104 62 59   Temp:  97.8 F (36.6 C) 98.3 F (36.8 C)   TempSrc:   Oral   Resp:  20 16   Height:      Weight:      SpO2:  94%  99%     I&O: Intake/Output      03/02 0701 - 03/03 0700 03/03 0701 - 03/04 0700   I.V. (mL/kg) 1600 (14.7)    Total Intake(mL/kg) 1600 (14.7)    Urine (mL/kg/hr) 1000 (0.4) 1100 (1.7)   Blood 700 (0.3)    Total Output 1700 1100   Net -100 -1100           Recent Labs  03/12/15 0035 03/13/15 0505  WBC 10.6* 18.2*  HGB 11.3* 11.0*  HCT 33.6* 33.3*  PLT 216 202    Blood type: --/--/A POS (03/02 0035) Rubella: Immune (07/27 0000)    Physical Exam:  General: alert, cooperative and no distress CV: Regular rate and rhythm Resp: CTA bilaterally Abdomen: soft, nontender, normal bowel sounds Incision: Covered with pressure dsg, Tegaderm and honeycomb dressing below; both w/o significant drainage, edema, bruising, or erythema; well approximated with suture. Uterine Fundus: firm, below umbilicus, nontender Lochia: minimal GU: Ext: edema 1+ BLE and Homans sign is negative, no sign of DVT   Assessment: POD # 1/ G2P2002/ S/P C/Section d/t arrest of dilation Doing well  Plan: Ambulate Remove pressure dsg Continue routine post op orders   Signed: Lawernce PittsBHAMBRI, Ronnae Kaser, N, MSN, CNM 03/13/2015, 12:58 PM

## 2015-03-14 MED ORDER — IBUPROFEN 600 MG PO TABS: 600.0000 mg | ORAL_TABLET | Freq: Four times a day (QID) | ORAL | Status: DC | PRN

## 2015-03-14 MED ORDER — OXYCODONE-ACETAMINOPHEN 5-325 MG PO TABS: 1.0000 | ORAL_TABLET | ORAL | Status: DC | PRN

## 2015-03-14 NOTE — Discharge Summary (Signed)
DISCHARGE SUMMARY:  Patient ID: Janet Holmes MRN: [id number] DOB/AGE: Mar 14, 1985 30 y.o.  Admit date: 03/12/2015 Admission Diagnoses: [redacted] weeks gestation, polyhydramnios   Discharge date: 03/14/2015 Discharge Diagnoses: S/P C/S on 03/12/15        Prenatal history: G2P2002   EDC: 03/17/2015, by Last Menstrual Period  Prenatal care at [facility] since [redacted] wks gestation. Primary provider: Dr. Billy Coast Prenatal course complicated by moderate polyhydramnios, LGA, EFW 4500 grams-declined PCS  Prenatal labs: ABO, Rh: --/--/A POS (03/02 0035) Antibody: NEG (03/02 0035) Rubella: Immune RPR: Non Reactive (03/02 0035)  HBsAg: Negative (07/27 0000)  HIV: Non-reactive (07/27 0000)  GBS: Negative (02/10 0000)  GTT: 91  Medical / Surgical History :  Past medical history:  Past Medical History  Diagnosis Date  . Clomid pregnancy   . Anemia   . Condyloma acuminatum   . Polyhydramnios, antepartum complication   . Polycystic ovaries   . H/O varicella   . Polyhydramnios in third trimester for IOL 09/27/2011  . Postpartum care following vaginal delivery (9/17, 2nd deg perineal lac) 09/28/2011    Past surgical history:  Past Surgical History  Procedure Laterality Date  . Wisdom tooth extraction    . Mole removal    . Knee arthroscopy      R  . Cesarean section N/A 03/12/2015    Procedure: CESAREAN SECTION;  Surgeon: Olivia Mackie, MD;  Location: [facility];  Service: Obstetrics;  Laterality: N/A;     Medications on Admission: Prescriptions prior to admission  Medication Sig Dispense Refill Last Dose  . acetaminophen (TYLENOL) 500 MG tablet Take 500-1,000 mg by mouth every 6 (six) hours as needed for mild pain or headache.    prn  . Docosahexaenoic Acid (DHA COMPLETE) 200 MG CAPS Take 1 capsule by mouth at bedtime.   03/11/2015 at Unknown time  . Prenatal Vit-Fe Fumarate-FA (PRENATAL MULTIVITAMIN) TABS Take 1 tablet by mouth at bedtime.   03/11/2015 at Unknown time  .  [DISCONTINUED] ibuprofen (ADVIL,MOTRIN) 600 MG tablet Take 1 tablet (600 mg total) by mouth every 6 (six) hours. 30 tablet 0 01/11/2013 at Unknown time  . [DISCONTINUED] ipratropium (ATROVENT) 0.06 % nasal spray Place 2 sprays into both nostrils 4 (four) times daily. 15 mL 12     Allergies: Polytrim   Intrapartum Course:  Admitted for IOL, Cytotec, AROM-clear, Pitocin, epidural, arrest of active phase, PCS.   Postpartum Course: Uncomplicated, d/c'd on POD #2.  Physical Exam:   VSS: Blood pressure 119/58, pulse 63, temperature 97.8 F (36.6 C), temperature source Oral, resp. rate 18, height  (1.651 m), weight 108.863 kg (240 lb), last menstrual period 06/10/2014, SpO2 98 %, unknown if currently breastfeeding.  LABS:  Recent Labs  03/12/15 0035 03/13/15 0505  WBC 10.6* 18.2*  HGB 11.3* 11.0*  PLT 216 202    General: alert and oriented x3 Heart: RRR Lungs: CTA bilaterally GI: soft, non-tender, non-distended, BS x4 Lochia: small Uterus: firm below umbilicus Incision: well approximated with suture and steri strips / honeycomb dressing-no significant erythema, drainage, or edema Extremities: 1+ BLE edema, Homans neg   Newborn Data Live born female  Birth Weight: 10 lb 6.3 oz (4715 g) APGAR: 7, 9  See operative report for further details  Home with mother.  Discharge Instructions:  Wound Care: keep clean and dry / remove honeycomb POD 6 Postpartum Instructions: [facility] discharge booklet - instructions reviewed Medications:    Medication List    STOP taking these medications  acetaminophen 500 MG tablet  Commonly known as:  TYLENOL      TAKE these medications        DHA COMPLETE 200 MG Caps  Generic drug:  Docosahexaenoic Acid  Take 1 capsule by mouth at bedtime.     ibuprofen 600 MG tablet  Commonly known as:  ADVIL,MOTRIN  Take 1 tablet (600 mg total) by mouth every 6 (six) hours as needed for mild pain.     oxyCODONE-acetaminophen 5-325 MG  tablet  Commonly known as:  PERCOCET/ROXICET  Take 1-2 tablets by mouth every 4 (four) hours as needed for severe pain.     prenatal multivitamin Tabs tablet  Take 1 tablet by mouth at bedtime.            Follow-up Information    Follow up with Lenoard AdenAAVON,RICHARD J, MD. Schedule an appointment as soon as possible for a visit in 6 weeks.   Specialty:  Obstetrics and Gynecology   Contact information:   [address] [place] [address] [phone]         Signed: Donette LarryBHAMBRI, Djeneba Barsch, Dorris CarnesN MSN, CNM 03/14/2015, 11:23 AM

## 2015-03-14 NOTE — Progress Notes (Signed)
POD # 2  Subjective: Pt reports feeling well, desires early discharge/ Pain controlled with Motrin and Percocet Tolerating po/Voiding without problems/ No n/v/ Flatus present Activity: ad lib Bleeding is light Newborn info:  Information for the patient's newborn:  Vonna KotykDixon, Boy Maigen [[id number]][[id number]]  female  / Circumcision: done/ Feeding: breast  Objective: VS: VS:  Filed Vitals:   03/13/15 1201 03/13/15 1548 03/13/15 2037 03/14/15 0646  BP:  121/49 112/56 119/58  Pulse:  60 72 63  Temp:  97.6 F (36.4 C) 98.3 F (36.8 C) 97.8 F (36.6 C)  TempSrc:  Oral    Resp:  18 20 18   Height:      Weight:      SpO2: 99% 99% 98%     I&O: Intake/Output      03/03 0701 - 03/04 0700 03/04 0701 - 03/05 0700   P.O. 960    I.V. (mL/kg)     Total Intake(mL/kg) 960 (8.8)    Urine (mL/kg/hr) 1900 (0.7)    Blood     Total Output 1900     Net -940            LABS:  Recent Labs  03/12/15 0035 03/13/15 0505  WBC 10.6* 18.2*  HGB 11.3* 11.0*  PLT 216 202   Blood type: --/--/A POS (03/02 0035) Rubella: Immune (07/27 0000)           Physical Exam:  General: alert, cooperative and no distress CV: Regular rate and rhythm Resp: CTA bilaterally Abdomen: soft, nontender, normal bowel sounds Incision: Covered with Tegaderm and honeycomb dressing; no significant drainage, edema, bruising, or erythema; well approximated with suture and steri strips Uterine Fundus: firm, below umbilicus, nontender Lochia: minimal Ext: edema 1+ BLE and Homans sign is negative, no sign of DVT   Assessment: POD # 2/ G2P2002/ S/P C/Section d/t arrest Doing well and stable for discharge home  Plan: Discharge home RX's: Ibuprofen 600mg  po Q 6 hrs prn pain #30 Refill x 1 Percocet 5/325 1 - 2 tabs po every 4 hrs prn pain #30 Refill x 0 [facility] given    Signed: Donette LarryBHAMBRI, Coline Calkin, Dorris CarnesN, MSN, CNM 03/14/2015, 11:15 AM

## 2015-03-17 ENCOUNTER — Inpatient Hospital Stay (HOSPITAL_COMMUNITY): Admission: AD | Admit: 2015-03-17 | Payer: 59 | Source: Ambulatory Visit | Admitting: Obstetrics and Gynecology

## 2015-04-17 ENCOUNTER — Encounter (HOSPITAL_COMMUNITY): Payer: Self-pay | Admitting: Obstetrics and Gynecology

## 2015-04-22 DIAGNOSIS — Z1151 Encounter for screening for human papillomavirus (HPV): Secondary | ICD-10-CM | POA: Diagnosis not present

## 2015-04-22 MED FILL — NORETHINDRONE 0.35 MG TAB: 0.35 | 28 days supply | Qty: 28 | Fill #0

## 2015-08-21 DIAGNOSIS — H5213 Myopia, bilateral: Secondary | ICD-10-CM | POA: Diagnosis not present

## 2016-07-06 DIAGNOSIS — Z01419 Encounter for gynecological examination (general) (routine) without abnormal findings: Secondary | ICD-10-CM | POA: Diagnosis not present

## 2016-07-06 DIAGNOSIS — Z6829 Body mass index (BMI) 29.0-29.9, adult: Secondary | ICD-10-CM | POA: Diagnosis not present

## 2016-07-08 DIAGNOSIS — Z30430 Encounter for insertion of intrauterine contraceptive device: Secondary | ICD-10-CM | POA: Diagnosis not present

## 2016-08-03 DIAGNOSIS — N939 Abnormal uterine and vaginal bleeding, unspecified: Secondary | ICD-10-CM | POA: Diagnosis not present

## 2016-08-03 MED FILL — predniSONE 20 MG TABS: 20 | 5 days supply | Qty: 10 | Fill #0

## 2016-11-22 ENCOUNTER — Other Ambulatory Visit: Payer: Self-pay | Admitting: Family

## 2016-11-22 MED ORDER — AMOXICILLIN-POT CLAVULANATE 875-125 MG PO TABS: 1.0000 | ORAL_TABLET | Freq: Two times a day (BID) | ORAL | 0 refills | Status: AC

## 2016-11-22 MED FILL — AMOX-CLAV 875-125 MG TABLET: 875-125 | 7 days supply | Qty: 14 | Fill #0

## 2017-03-09 DIAGNOSIS — H5213 Myopia, bilateral: Secondary | ICD-10-CM | POA: Diagnosis not present

## 2017-05-16 ENCOUNTER — Telehealth: Payer: Self-pay

## 2017-05-16 NOTE — Telephone Encounter (Signed)
Pre-visit, unable to be reached by phone.

## 2017-05-17 ENCOUNTER — Ambulatory Visit (INDEPENDENT_AMBULATORY_CARE_PROVIDER_SITE_OTHER): Payer: 59 | Admitting: Family

## 2017-05-17 ENCOUNTER — Encounter: Payer: Self-pay | Admitting: Family

## 2017-05-17 VITALS — BP 127/73 | HR 74 | Temp 98.6°F | Resp 16 | Ht 64.0 in | Wt 175.2 lb

## 2017-05-17 DIAGNOSIS — E282 Polycystic ovarian syndrome: Secondary | ICD-10-CM | POA: Diagnosis not present

## 2017-05-17 DIAGNOSIS — G43909 Migraine, unspecified, not intractable, without status migrainosus: Secondary | ICD-10-CM

## 2017-05-17 DIAGNOSIS — L237 Allergic contact dermatitis due to plants, except food: Secondary | ICD-10-CM

## 2017-05-17 MED ORDER — SUMATRIPTAN SUCCINATE 50 MG PO TABS: ORAL_TABLET | ORAL | 5 refills | Status: DC

## 2017-05-17 MED ORDER — PREDNISONE 10 MG PO TABS
ORAL_TABLET | ORAL | 0 refills | Status: DC
Start: 2017-05-17 — End: 2018-05-30

## 2017-05-17 MED FILL — SUMAtriptan SUCCINATE 50 MG: 50 | 30 days supply | Qty: 10 | Fill #0

## 2017-05-17 MED FILL — predniSONE 10 MG TABS: 10 | 8 days supply | Qty: 20 | Fill #0

## 2017-05-17 NOTE — Patient Instructions (Signed)
Please begin prednisone taper for poison ivy. You may use imitrex as needed. Schedule complete physical at your convenience.  Welcome to [facility]!

## 2017-05-17 NOTE — Progress Notes (Signed)
Subjective:    Patient ID: Janet Holmes, female    DOB: 01-Dec-1985, 32 y.o.   MRN: [id number]  HPI   Patient is a 32 yr old female who presents today to establish care.  She has concern today about a pruritic rash. Reports that it began after she did some yard work.  Very uncomfortable.   Migraines- occurs about once a month, has visual aura precursor to HA- resolves generally with aleve/benadryl and sleep. Has never tried any rx meds for migraines. Thinks that her IUD may be contributing to her sxs but they are not bad enough for her to consider removal of her IUD. She sees Dr. Billy Coast for GYN care.   pmhx is also significant for PCOS.  Review of Systems    see HPI  Past Medical History:  Diagnosis Date  . Anemia   . Chicken pox   . Clomid pregnancy   . Condyloma acuminatum   . H/O varicella   . Migraines   . Polycystic ovaries   . Polyhydramnios in third trimester for IOL 09/27/2011  . Polyhydramnios, antepartum complication   . Postpartum care following vaginal delivery (9/17, 2nd deg perineal lac) 09/28/2011     Social History   Socioeconomic History  . Marital status: Married    Spouse name: JOEY  . Number of children: 2  . Years of education: Not on file  . Highest education level: Not on file  Occupational History  . Occupation: NP  Social Needs  . Financial resource strain: Not hard at all  . Food insecurity:    Worry: Never true    Inability: Never true  . Transportation needs:    Medical: Not on file    Non-medical: No  Tobacco Use  . Smoking status: Former Smoker    Last attempt to quit: 05/17/2005    Years since quitting: 12.0  . Smokeless tobacco: Never Used  Substance and Sexual Activity  . Alcohol use: No  . Drug use: No  . Sexual activity: Yes    Partners: Male    Birth control/protection: None  Lifestyle  . Physical activity:    Days per week: 4 days    Minutes per session: 70 min  . Stress: To some extent  Relationships  . Social  connections:    Talks on phone: Three times a week    Gets together: Once a week    Attends religious service: Never    Active member of club or organization: Yes    Attends meetings of clubs or organizations: More than 4 times per year    Relationship status: Married  . Intimate partner violence:    Fear of current or ex partner: No    Emotionally abused: No    Physically abused: No    Forced sexual activity: No  Other Topics Concern  . Not on file  Social History Narrative  . Not on file    Past Surgical History:  Procedure Laterality Date  . CESAREAN SECTION N/A 03/12/2015   Procedure: CESAREAN SECTION;  Surgeon: Olivia Mackie, MD;  Location: [facility];  Service: Obstetrics;  Laterality: N/A;  . KNEE ARTHROSCOPY     R  . MOLE REMOVAL    . WISDOM TOOTH EXTRACTION      Family History  Problem Relation Age of Onset  . Cancer Paternal Grandmother        breast, skin  . Hypertension Paternal Grandmother   . Stroke Paternal Grandmother   .  Stroke Paternal Grandfather   . Hypertension Paternal Grandfather   . Diabetes Paternal Grandfather   . Cancer Mother        skin  . Varicose Veins Mother   . Cancer Maternal Grandmother        breast  . Hypertension Maternal Grandfather   . Stroke Maternal Grandfather   . COPD Father   . Depression Father   . Other Sister        frontal lobe disorder  . Rheum arthritis Brother     Allergies  Allergen Reactions  . Polytrim [Polymyxin B-Trimethoprim] Other (See Comments)    Eye swelling    Current Outpatient Medications on File Prior to Visit  Medication Sig Dispense Refill  . cholecalciferol (VITAMIN D) 1000 units tablet Take 1,000 Units by mouth daily.    Marland Kitchen ibuprofen (ADVIL,MOTRIN) 600 MG tablet Take 1 tablet (600 mg total) by mouth every 6 (six) hours as needed for mild pain. 30 tablet 0  . levonorgestrel (MIRENA, 52 MG,) 20 MCG/24HR IUD 1 each by Intrauterine route once.    . naproxen (NAPROSYN) 250 MG tablet Take by mouth  2 (two) times daily with a meal.     No current facility-administered medications on file prior to visit.     BP 127/73 (BP Location: Right Arm, Patient Position: Sitting, Cuff Size: Small)   Pulse 74   Temp 98.6 F (37 C) (Oral)   Resp 16   Ht  (1.626 m)   Wt 175 lb 3.2 oz (79.5 kg)   SpO2 98%   BMI 30.07 kg/m    Objective:   Physical Exam  Constitutional: She appears well-developed and well-nourished.  HENT:  Head: Normocephalic and atraumatic.  Right Ear: Tympanic membrane and ear canal normal.  Left Ear: Tympanic membrane and ear canal normal.  Mouth/Throat: Oropharynx is clear and moist. No oropharyngeal exudate, posterior oropharyngeal edema or posterior oropharyngeal erythema.  Cardiovascular: Normal rate, regular rhythm and normal heart sounds.  No murmur heard. Pulmonary/Chest: Effort normal and breath sounds normal. No respiratory distress. She has no wheezes.  Skin:  Raised linear rash marks noted on bilateral forearms  Psychiatric: She has a normal mood and affect. Her behavior is normal. Judgment and thought content normal.          Assessment & Plan:  Poison ivy dermatitis- rx with prednisone taper.   Migraines- trial of prn imitrex.  Hx of PCOS- plan to check glucose at her upcoming cpx.

## 2017-05-18 NOTE — Addendum Note (Signed)
Addended by: Sandford Craze on: 05/18/2017 01:49 PM   Modules accepted: Level of Service

## 2017-05-24 ENCOUNTER — Ambulatory Visit (INDEPENDENT_AMBULATORY_CARE_PROVIDER_SITE_OTHER): Payer: 59 | Admitting: Family

## 2017-05-24 ENCOUNTER — Encounter: Payer: Self-pay | Admitting: Family

## 2017-05-24 VITALS — BP 123/44 | HR 69 | Temp 98.2°F | Resp 16 | Ht 64.0 in | Wt 176.0 lb

## 2017-05-24 DIAGNOSIS — Z0000 Encounter for general adult medical examination without abnormal findings: Secondary | ICD-10-CM

## 2017-05-24 LAB — HEPATIC FUNCTION PANEL
ALT: 16 U/L (ref 0–35)
AST: 15 U/L (ref 0–37)
Albumin: 4.4 g/dL (ref 3.5–5.2)
Alkaline Phosphatase: 29 U/L — ABNORMAL LOW (ref 39–117)
Bilirubin, Direct: 0.2 mg/dL (ref 0.0–0.3)
Total Bilirubin: 1.4 mg/dL — ABNORMAL HIGH (ref 0.2–1.2)
Total Protein: 6.8 g/dL (ref 6.0–8.3)

## 2017-05-24 LAB — LIPID PANEL
Cholesterol: 155 mg/dL (ref 0–200)
HDL: 56.3 mg/dL
LDL Cholesterol: 87 mg/dL (ref 0–99)
NonHDL: 98.8
Total CHOL/HDL Ratio: 3
Triglycerides: 57 mg/dL (ref 0.0–149.0)
VLDL: 11.4 mg/dL (ref 0.0–40.0)

## 2017-05-24 LAB — URINALYSIS, ROUTINE W REFLEX MICROSCOPIC
Bilirubin Urine: NEGATIVE
Hgb urine dipstick: NEGATIVE
Ketones, ur: NEGATIVE
Leukocytes, UA: NEGATIVE
Nitrite: NEGATIVE
RBC / HPF: NONE SEEN (ref 0–?)
Specific Gravity, Urine: 1.015 (ref 1.000–1.030)
Total Protein, Urine: NEGATIVE
Urine Glucose: NEGATIVE
Urobilinogen, UA: 0.2 (ref 0.0–1.0)
pH: 7 (ref 5.0–8.0)

## 2017-05-24 LAB — CBC WITH DIFFERENTIAL/PLATELET
Basophils Absolute: 0.1 K/uL (ref 0.0–0.1)
Basophils Relative: 1 % (ref 0.0–3.0)
Eosinophils Absolute: 0.1 K/uL (ref 0.0–0.7)
Eosinophils Relative: 1.5 % (ref 0.0–5.0)
HCT: 42 % (ref 36.0–46.0)
Hemoglobin: 14 g/dL (ref 12.0–15.0)
Lymphocytes Relative: 34.1 % (ref 12.0–46.0)
Lymphs Abs: 2.1 K/uL (ref 0.7–4.0)
MCHC: 33.3 g/dL (ref 30.0–36.0)
MCV: 94.3 fl (ref 78.0–100.0)
Monocytes Absolute: 0.6 K/uL (ref 0.1–1.0)
Monocytes Relative: 9 % (ref 3.0–12.0)
Neutro Abs: 3.4 K/uL (ref 1.4–7.7)
Neutrophils Relative %: 54.4 % (ref 43.0–77.0)
Platelets: 207 K/uL (ref 150.0–400.0)
RBC: 4.45 Mil/uL (ref 3.87–5.11)
RDW: 13.5 % (ref 11.5–15.5)
WBC: 6.2 K/uL (ref 4.0–10.5)

## 2017-05-24 LAB — BASIC METABOLIC PANEL WITH GFR
BUN: 17 mg/dL (ref 6–23)
CO2: 29 meq/L (ref 19–32)
Calcium: 9.4 mg/dL (ref 8.4–10.5)
Chloride: 103 meq/L (ref 96–112)
Creatinine, Ser: 0.78 mg/dL (ref 0.40–1.20)
GFR: 90.27 mL/min
Glucose, Bld: 85 mg/dL (ref 70–99)
Potassium: 4.8 meq/L (ref 3.5–5.1)
Sodium: 139 meq/L (ref 135–145)

## 2017-05-24 LAB — TSH: TSH: 1.74 u[IU]/mL (ref 0.35–4.50)

## 2017-05-24 NOTE — Patient Instructions (Signed)
Please complete lab work prior to leaving. Continue to work on healthy diet, regular exercise and weight loss.  

## 2017-05-24 NOTE — Progress Notes (Signed)
Subjective:    Patient ID: Janet Holmes, female    DOB: [birth year], 32 y.o.   MRN: [id number]  HPI  Ms. Tippetts is a 32 yr old female who presents today for cpx.  Patient presents today for complete physical.  Immunizations: [id number] Diet: snacking.  Exercise: regular Pap Smear:  7/18 Dental: 12/18 Vision: 4/18   Review of Systems  Constitutional: Negative for unexpected weight change.  HENT: Positive for rhinorrhea. Negative for hearing loss.   Eyes: Negative for visual disturbance.  Respiratory: Negative for cough.   Cardiovascular:       Occasional LE edema after a long day  Genitourinary: Negative for dysuria, frequency and hematuria.  Musculoskeletal: Negative for arthralgias and myalgias.  Skin:       Resolving poison ivy  Neurological: Positive for headaches.  Hematological: Negative for adenopathy.  Psychiatric/Behavioral:       Denies depression/anxiety   Past Medical History:  Diagnosis Date  . Anemia   . Chicken pox   . Clomid pregnancy   . Condyloma acuminatum   . H/O varicella   . Migraines   . Polycystic ovaries   . Polyhydramnios in third trimester for IOL 09/27/2011  . Polyhydramnios, antepartum complication   . Postpartum care following vaginal delivery (9/17, 2nd deg perineal lac) 09/28/2011     Social History   Socioeconomic History  . Marital status: Married    Spouse name: JOEY  . Number of children: 2  . Years of education: Not on file  . Highest education level: Not on file  Occupational History  . Occupation: NP  Social Needs  . Financial resource strain: Not hard at all  . Food insecurity:    Worry: Never true    Inability: Never true  . Transportation needs:    Medical: Not on file    Non-medical: No  Tobacco Use  . Smoking status: Former Smoker    Last attempt to quit: 05/17/2005    Years since quitting: 12.0  . Smokeless tobacco: Never Used  Substance and Sexual Activity  . Alcohol use: No  . Drug use: No  . Sexual  activity: Yes    Partners: Male    Birth control/protection: None  Lifestyle  . Physical activity:    Days per week: 4 days    Minutes per session: 70 min  . Stress: To some extent  Relationships  . Social connections:    Talks on phone: Three times a week    Gets together: Once a week    Attends religious service: Never    Active member of club or organization: Yes    Attends meetings of clubs or organizations: More than 4 times per year    Relationship status: Married  . Intimate partner violence:    Fear of current or ex partner: No    Emotionally abused: No    Physically abused: No    Forced sexual activity: No  Other Topics Concern  . 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 CESAREAN SECTION N/A 03/12/2015   Procedure: CESAREAN SECTION;  Surgeon: Olivia Mackie, MD;  Location: [facility];  Service: Obstetrics;  Laterality: N/A;  . KNEE ARTHROSCOPY     R  . MOLE REMOVAL    . WISDOM TOOTH EXTRACTION      Family History  Problem Relation Age of Onset  . Cancer Paternal Grandmother        breast, skin  . Hypertension Paternal Grandmother   . Stroke Paternal Grandmother   . Stroke Paternal Grandfather   . Hypertension Paternal Grandfather   . Diabetes Paternal Grandfather   . Cancer Mother        skin (basal cell carcinoma)  . Varicose Veins Mother   . Cancer Maternal Grandmother        breast  . Hypertension Maternal Grandfather   . Stroke Maternal Grandfather   . COPD Father        former smoker  . Depression Father   . Other Sister        frontal lobe disorder (hx of 6 concussions)  . Anxiety disorder Brother        hx substance/alcohol abuse    Allergies  Allergen Reactions  . Polytrim  [Polymyxin B-Trimethoprim] Other (See Comments)    Eye swelling    Current Outpatient Medications on File Prior to Visit  Medication Sig Dispense Refill  . cholecalciferol (VITAMIN D) 1000 units tablet Take 1,000 Units by mouth daily.    Marland Kitchen ibuprofen (ADVIL,MOTRIN) 600 MG tablet Take 1 tablet (600 mg total) by mouth every 6 (six) hours as needed for mild pain. 30 tablet 0  . levonorgestrel (MIRENA, 52 MG,) 20 MCG/24HR IUD 1 each by Intrauterine route once.    . naproxen (NAPROSYN) 250 MG tablet Take by mouth 2 (two) times daily with a meal.    . predniSONE (DELTASONE) 10 MG tablet 4 tabs by mouth once daily for 2 days, then 3 tabs daily x 2 days, then 2 tabs daily x 2 days, then 1 tab daily x 2 days 20 tablet 0  . SUMAtriptan (IMITREX) 50 MG tablet One tab by mouth at start of migraine, may repeat in 2 hours if symptoms not improved 10 tablet 5   No current facility-administered medications on file prior to visit.     BP (!) 123/44 (BP Location: Right Arm, Patient Position: Sitting, Cuff Size: Small)   Pulse 69   Temp 98.2 F (36.8 C) (Oral)   Resp 16   Ht  (1.626 m)   Wt 176 lb (79.8 kg)   SpO2 99%   BMI 30.21 kg/m       Objective:   Physical Exam Physical Exam  Constitutional: She is oriented to person, place, and time. She appears well-developed and well-nourished. No distress.  HENT:  Head: Normocephalic and atraumatic.  Right Ear: Tympanic membrane and ear canal normal.  Left Ear: Tympanic membrane and ear canal normal.  Mouth/Throat: Oropharynx is clear and moist.  Eyes: Pupils are equal, round, and reactive to light. No scleral icterus.  Neck: Normal range of motion. No thyromegaly present.  Cardiovascular: Normal rate and regular rhythm.   No murmur heard. Pulmonary/Chest: Effort normal and breath sounds normal. No respiratory distress. He has no wheezes. She has no rales. She exhibits no tenderness.  Abdominal: Soft. Bowel sounds are normal. She exhibits no  distension and no mass. There is no tenderness. There is no rebound and no guarding.  Musculoskeletal: She exhibits no edema.  Lymphadenopathy:    She has no cervical adenopathy.  Neurological: She is alert and oriented to person, place, and time. She has  normal patellar reflexes. She exhibits normal muscle tone. Coordination normal.  Skin: Skin is warm and dry.  Psychiatric: She has a normal mood and affect. Her behavior is normal. Judgment and thought content normal.  Breasts: Examined lying Right: Without masses, retractions, discharge or axillary adenopathy. (bilateral breast implants are noted) Left: Without masses, retractions, discharge or axillary adenopathy.        Assessment & Plan:   Preventative Care- discussed healthy diet, exercise, weight loss.  Pap and immunizations reviewed and up to date.  Obtain routine lab work.       Assessment & Plan:

## 2017-05-25 ENCOUNTER — Other Ambulatory Visit: Payer: Self-pay

## 2017-05-25 DIAGNOSIS — R7989 Other specified abnormal findings of blood chemistry: Secondary | ICD-10-CM

## 2017-06-28 ENCOUNTER — Encounter: Payer: 59 | Attending: Family | Admitting: Registered"

## 2017-06-28 DIAGNOSIS — Z713 Dietary counseling and surveillance: Secondary | ICD-10-CM | POA: Insufficient documentation

## 2017-06-28 NOTE — Progress Notes (Signed)
Medical Nutrition Therapy:  Appt start time: 1515 end time:  1600.  [employer] Employee visit 1 of 3  Assessment:  Primary concerns today: . Pt states she would like to learn about good nutrition, losing weight, and overall nutritional strategies for preventative healthcare.   Pt states for the last 5 weeks she has been listening to a podcast: weight loss for busy physicians and states it is very helpful to understand the thought process that drives some of the behaviors and food choices that seem to be common among health care professionals. Pt states in that time she has lost 9 lbs.  Pt states she tried intermittent fasting and skipping breakfast was the easiest way to adapt the eating window to her life. Pt states she is not doing IF currently, but is not hungry at breakfast time. Pt states she believes snacking is what is one of her grazing issues.  Sleep 6-8 hrs, sometimes interrupted sleep. Pt states 2 yrs ago she developed the habit of falling asleep watching TV on couch then going bed.  Pt states she does not have an issue with being physically active. 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    Preferred Learning Style:   Auditory  Visual  Hands on  No preference indicated   Learning Readiness:   Change in progress   MEDICATIONS: reviewed   DIETARY INTAKE:  Usual eating pattern includes 2 meals and 1-2 snacks per day.  Everyday foods include coffee/cream.   24-hr recall:  B ( AM): none OR coffee cream  Snk ( AM): none  L ( PM): salad, eggs, pumpkin seeds, fruit, nuts (half caf coffee) Snk ( PM): none or nuts D ( PM): meat (chicken, pork,) OR chicken coconut curry, brown rice Snk ( PM): none Beverages: 24-48 oz water, herbal tea, a lot of coffee (24 oz?)  Usual physical activity: teaches fitness class, running  Estimated energy needs: 2000 calories  Progress Towards Goal(s):  In progress.   Nutritional Diagnosis:  NI-1.4 Inadequate energy intake As  related to skipping meals.  As evidenced by dietary recall and stated habit of skipping breakfast.    Intervention:  Nutrition Education. Discussed balanced eating and importance of each food group. Discussed how lack of sleep/fatigue can drive other behaviors such as grazing. Discussed whole grains, lean proteins, and vegetables importance in health. Discussed mindful eating.  Aim to eat your meals without distractions and pay attention to how the food makes you feel. Consider having breakfast more often Consider including more whole grains Consider increasing your fish intake to 2-3x week. Include beans with your meals often  Continue staying active. Consider getting closer to 8 hours sleep each night. The sleep hygiene tip sheet might help.  Teaching Method Utilized:  Visual Auditory  Handouts given during visit include:  n-3 fish  Sleep hygiene  MyPlate Planner  Barriers to learning/adherence to lifestyle change: none  Demonstrated degree of understanding via:  Teach Back   Monitoring/Evaluation:  Dietary intake, exercise, sleep, and body weight in 4 week(s).

## 2017-06-28 NOTE — Patient Instructions (Addendum)
Aim to eat your meals without distractions and pay attention to how the food makes you feel. Consider having breakfast more often Consider including more whole grains Consider increasing your fish intake to 2-3x week. Include beans with your meals often  Continue staying active. Consider getting closer to 8 hours sleep each night. The sleep hygiene tip sheet might help.

## 2017-07-06 ENCOUNTER — Encounter: Payer: 59 | Admitting: Registered"

## 2017-07-06 DIAGNOSIS — Z713 Dietary counseling and surveillance: Secondary | ICD-10-CM

## 2017-07-06 NOTE — Progress Notes (Signed)
Medical Nutrition Therapy:  Appt start time: 1500 end time:  1530.  [employer] Employee visit 2 of 3  Assessment:  Primary concerns today: . Pt states she has lost additional 2 lbs. since last visit. Pt states she didn't realize how much she missed breakfast and is enjoying having breakfast (oatmeal or cottage cheese/fruit, vegas shakes) with daughter. Pt states she aims 2x day to include whole grain. Pt also states she is being mindful of how much protein because as a culture we tend to over-do protein.  Pt states she is working on getting better sleep habits and not falling asleep on couch as often and valarian root tea helps sleep.  Patient states she continues to work on no snacks between meals and finds that when she is looking for a snack before dinner she is able to check in with her hunger level and decide if she is at the point where waiting until dinner is the better option than snacking.  Pt states continues to listen to podcast: weight loss for busy physicians.  Preferred Learning Style:   No preference indicated   Learning Readiness:   Change in progress  MEDICATIONS: reviewed   DIETARY INTAKE:  Usual eating pattern includes 2 meals and 1-2 snacks per day.  Everyday foods include coffee/cream.   24-hr recall:  B ( AM): vega chocolate shake Snk ( AM): none  L ( PM): Asian frozen meal, 2 eggs Snk ( PM): none or nuts D ( PM): taco salad Snk ( PM): none Beverages: 24-48 oz water, herbal tea, a lot of coffee   Usual physical activity: teaches fitness class, running  Estimated energy needs: 2000 calories  Progress Towards Goal(s):  In progress.   Nutritional Diagnosis:  NI-1.4 Inadequate energy intake As related to skipping meals.  As evidenced by dietary recall and stated habit of skipping breakfast.    Intervention:  Nutrition Education. Discussed balanced eating and importance of each food group. Discussed weight neutral counseling and reason for not focusing on the  scale as a measure of healthy behaviors.  Plan:  Aim to eat your meals without distractions and pay attention to how the food makes you feel.(in progress)  Consider having breakfast more often (meeting)  Consider including more whole grains (in progress)  Consider increasing your fish intake to 2-3x week. (no progress)  Include beans with your meals often (4 bean-focused meals last week)  Continue staying active. (meeting)  Consider getting closer to 8 hours sleep each night. (in progress)  Teaching Method Utilized:  Visual Auditory  Handouts given during visit include:  none  Barriers to learning/adherence to lifestyle change: none  Demonstrated degree of understanding via:  Teach Back   Monitoring/Evaluation:  Dietary intake, exercise, sleep, and body weight in 4 week(s).

## 2017-07-07 NOTE — Patient Instructions (Signed)
Plan:  Aim to eat your meals without distractions and pay attention to how the food makes you feel.(in progress)  Consider having breakfast more often (meeting)  Consider including more whole grains (in progress)  Consider increasing your fish intake to 2-3x week. (no progress)  Include beans with your meals often (4 bean-focused meals last week)  Continue staying active. (meeting)  Consider getting closer to 8 hours sleep each night. (in progress)

## 2017-07-12 ENCOUNTER — Encounter: Payer: 59 | Attending: Family | Admitting: Registered"

## 2017-07-12 DIAGNOSIS — Z713 Dietary counseling and surveillance: Secondary | ICD-10-CM | POA: Insufficient documentation

## 2017-07-12 NOTE — Progress Notes (Signed)
Medical Nutrition Therapy:  Appt start time: 1150 end time:  1220.  [employer] Employee visit 3 of 3  Assessment:  Primary concerns today: . Pt states she has lost additional 1 lb since last visit, clothes are looser and she is feeling good about her progress. Pt states she continues to eat breakfast. Pt states she is still working to include whole grains and states the other night she had a headache and was not feeling well and thought back through the day and realized she had not had many carbs in the day and after dinner that included rice pt states she felt better.  Pt states she sleep has not been great lately, but continues to be mindful of importance of sleep.  Preferred Learning Style:   No preference indicated   Learning Readiness:   Change in progress  MEDICATIONS: reviewed   DIETARY INTAKE:  Usual eating pattern includes 2 meals and 1-2 snacks per day.  Everyday foods include coffee/cream.   24-hr recall: did not assess this visit B ( AM):  Snk ( AM):  L ( PM):  Snk ( PM):  D ( PM): Snk ( PM):  Beverages:   Usual physical activity: teaches fitness class, running  Estimated energy needs: 2000 calories  Progress Towards Goal(s):  In progress.   Nutritional Diagnosis:  NI-1.4 Inadequate energy intake As related to skipping meals.  As evidenced by dietary recall and stated habit of skipping breakfast.    Intervention:  Nutrition Education. Discussed organic food, no nutritional difference but for pesticide concerns can consider Dirty Dozen/Clean 15. Discussed weight neutral counseling and reason for not focusing on the scale as a measure of healthy behaviors.  Plan:  Aim to eat your meals without distractions and pay attention to how the food makes you feel.(in progress)  Consider having breakfast more often (meeting)  Consider including more whole grains (in progress)   Consider increasing your fish intake to 2-3x week. (maybe 2x week)  Include beans with your  meals often (continues to meet)  Continue staying active. (meeting)  Consider getting closer to 8 hours sleep each night. (still a struggle)  Teaching Method Utilized:  Visual Auditory  Handouts given during visit include:  Research study on Ultra-processed diet vs unprocessed diet  Barriers to learning/adherence to lifestyle change: none  Demonstrated degree of understanding via:  Teach Back   Monitoring/Evaluation:  Dietary intake, exercise, sleep, and body weight prn.

## 2017-07-12 NOTE — Patient Instructions (Signed)
Although there is not a lot of research on the benefits of organic foods, there may be some merit to choosing them. Dirty Dozen - Clean 15 are some guides to help choose foods that are most/least affected by pesticide residue. As you are able continue to work on sleep habits and avoiding digital media before bed. Continue including whole grains and beans in your diet. Continue having fish regularly.

## 2018-01-01 MED FILL — OSELTAMIVIR PHOSPHATE 75 MG: 75 | 10 days supply | Qty: 10 | Fill #0

## 2018-05-28 ENCOUNTER — Encounter: Payer: 59 | Admitting: Family

## 2018-05-30 ENCOUNTER — Encounter: Payer: Self-pay | Admitting: Family

## 2018-05-30 ENCOUNTER — Other Ambulatory Visit: Payer: Self-pay

## 2018-05-30 ENCOUNTER — Ambulatory Visit (INDEPENDENT_AMBULATORY_CARE_PROVIDER_SITE_OTHER): Payer: 59 | Admitting: Family

## 2018-05-30 VITALS — BP 115/63 | HR 66 | Temp 97.8°F | Resp 16 | Ht 64.5 in | Wt 154.6 lb

## 2018-05-30 DIAGNOSIS — E282 Polycystic ovarian syndrome: Secondary | ICD-10-CM

## 2018-05-30 DIAGNOSIS — Z0000 Encounter for general adult medical examination without abnormal findings: Secondary | ICD-10-CM | POA: Diagnosis not present

## 2018-05-30 LAB — CBC WITH DIFFERENTIAL/PLATELET
Basophils Absolute: 0 10*3/uL (ref 0.0–0.1)
Basophils Relative: 1 % (ref 0.0–3.0)
Eosinophils Absolute: 0.1 10*3/uL (ref 0.0–0.7)
Eosinophils Relative: 1.8 % (ref 0.0–5.0)
HCT: 39.4 % (ref 36.0–46.0)
Hemoglobin: 13.4 g/dL (ref 12.0–15.0)
Lymphocytes Relative: 37.8 % (ref 12.0–46.0)
Lymphs Abs: 1.5 10*3/uL (ref 0.7–4.0)
MCHC: 34.2 g/dL (ref 30.0–36.0)
MCV: 95.5 fl (ref 78.0–100.0)
Monocytes Absolute: 0.3 10*3/uL (ref 0.1–1.0)
Monocytes Relative: 7.5 % (ref 3.0–12.0)
Neutro Abs: 2.1 10*3/uL (ref 1.4–7.7)
Neutrophils Relative %: 51.9 % (ref 43.0–77.0)
Platelets: 173 10*3/uL (ref 150.0–400.0)
RBC: 4.12 Mil/uL (ref 3.87–5.11)
RDW: 13.7 % (ref 11.5–15.5)
WBC: 4 10*3/uL (ref 4.0–10.5)

## 2018-05-30 LAB — HEPATIC FUNCTION PANEL
ALT: 19 U/L (ref 0–35)
AST: 18 U/L (ref 0–37)
Albumin: 4.6 g/dL (ref 3.5–5.2)
Alkaline Phosphatase: 29 U/L — ABNORMAL LOW (ref 39–117)
Bilirubin, Direct: 0.2 mg/dL (ref 0.0–0.3)
Total Bilirubin: 1 mg/dL (ref 0.2–1.2)
Total Protein: 6.7 g/dL (ref 6.0–8.3)

## 2018-05-30 LAB — BASIC METABOLIC PANEL
BUN: 16 mg/dL (ref 6–23)
CO2: 29 mEq/L (ref 19–32)
Calcium: 9.2 mg/dL (ref 8.4–10.5)
Chloride: 104 mEq/L (ref 96–112)
Creatinine, Ser: 0.77 mg/dL (ref 0.40–1.20)
GFR: 85.68 mL/min (ref >60.00)
Glucose, Bld: 77 mg/dL (ref 70–99)
Potassium: 4.7 mEq/L (ref 3.5–5.1)
Sodium: 140 mEq/L (ref 135–145)

## 2018-05-30 LAB — LIPID PANEL
Cholesterol: 177 mg/dL (ref 0–200)
HDL: 68.2 mg/dL (ref >39.00)
LDL Cholesterol: 96 mg/dL (ref 0–99)
NonHDL: 108.64
Total CHOL/HDL Ratio: 3
Triglycerides: 62 mg/dL (ref 0.0–149.0)
VLDL: 12.4 mg/dL (ref 0.0–40.0)

## 2018-05-30 LAB — TSH: TSH: 2.4 u[IU]/mL (ref 0.35–4.50)

## 2018-05-30 LAB — HEMOGLOBIN A1C: Hgb A1c MFr Bld: 5.4 % (ref 4.6–6.5)

## 2018-05-30 MED ORDER — BETAMETHASONE VALERATE 0.1 % EX OINT
1.0000 | TOPICAL_OINTMENT | Freq: Two times a day (BID) | CUTANEOUS | 1 refills | Status: DC
Start: 2018-05-30 — End: 2020-06-03

## 2018-05-30 NOTE — Progress Notes (Signed)
Subjective:    Patient ID: Janet Holmes, female    DOB: 1985/05/28, 33 y.o.   MRN: [id number]  HPI  Patient presents today for complete physical.  Immunizations: tetanus 2017 Diet: healthy Exercise: reports that she exercises 30-90 minutes 6 days a week.  Pap Smear: 07/11/16  Wt Readings from Last 3 Encounters:  05/30/18 154 lb 9.6 oz (70.1 kg)  05/24/17 176 lb (79.8 kg)  05/17/17 175 lb 3.2 oz (79.5 kg)      Review of Systems  Constitutional: Negative for unexpected weight change.  HENT: Negative for rhinorrhea.   Respiratory: Negative for cough and shortness of breath.   Cardiovascular: Negative for chest pain and leg swelling.  Gastrointestinal: Negative for diarrhea, nausea and vomiting.  Genitourinary: Negative for dysuria, frequency and hematuria.  Musculoskeletal: Negative for arthralgias and myalgias.  Skin: Positive for rash.       Rash left dorsal foot  Neurological:       No recent headaches  Hematological: Negative for adenopathy.  Psychiatric/Behavioral:       Denies depression/anxiety   Past Medical History:  Diagnosis Date  . Anemia   . Chicken pox   . Clomid pregnancy   . Condyloma acuminatum   . H/O varicella   . Migraines   . Polycystic ovaries   . Polyhydramnios in third trimester for IOL 09/27/2011  . Polyhydramnios, antepartum complication   . Postpartum care following vaginal delivery (9/17, 2nd deg perineal lac) 09/28/2011     Social History   Socioeconomic History  . Marital status: Married    Spouse name: JOEY  . Number of children: 2  . Years of education: Not on file  . Highest education level: Not on file  Occupational History  . Occupation: NP  Social Needs  . Financial resource strain: Not hard at all  . Food insecurity:    Worry: Never true    Inability: Never true  . Transportation needs:    Medical: Not on file    Non-medical: No  Tobacco Use  . Smoking status: Former Smoker    Last attempt to quit: 05/17/2005    Years since quitting: 13.0  . Smokeless tobacco: Never Used  Substance and Sexual Activity  . Alcohol use: No  . Drug use: No  . Sexual activity: Yes    Partners: Male    Birth control/protection: None  Lifestyle  . Physical activity:    Days per week: 4 days    Minutes per session: 70 min  . Stress: To some extent  Relationships  . Social connections:    Talks on phone: Three times a week    Gets together: Once a week    Attends religious service: Never    Active member of club or organization: Yes    Attends meetings of clubs or organizations: More than 4 times per year    Relationship status: Married  . Intimate partner violence:    Fear of current or ex partner: No    Emotionally abused: No    Physically abused: No    Forced sexual activity: No  Other Topics Concern  . 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 CESAREAN SECTION N/A 03/12/2015   Procedure: CESAREAN SECTION;  Surgeon: Olivia Mackie, MD;  Location: [facility];  Service: Obstetrics;  Laterality: N/A;  . KNEE ARTHROSCOPY     R  . MOLE REMOVAL    . WISDOM TOOTH EXTRACTION      Family History  Problem Relation Age of Onset  . Cancer Paternal Grandmother        breast, skin  . Hypertension Paternal Grandmother   . Stroke Paternal Grandmother   . Stroke Paternal Grandfather   . Hypertension Paternal Grandfather   . Diabetes Paternal Grandfather   . Cancer Mother        skin (basal cell carcinoma)  . Varicose Veins Mother   . Cancer Maternal Grandmother        breast  . Hypertension Maternal Grandfather   . Stroke Maternal Grandfather   . COPD Father        former smoker  . Depression Father   . Other Sister        frontal lobe disorder  (hx of 6 concussions)  . Anxiety disorder Brother        hx substance/alcohol abuse    Allergies  Allergen Reactions  . Polytrim [Polymyxin B-Trimethoprim] Other (See Comments)    Eye swelling    Current Outpatient Medications on File Prior to Visit  Medication Sig Dispense Refill  . cholecalciferol (VITAMIN D) 1000 units tablet Take 1,000 Units by mouth daily.    Marland Kitchen ibuprofen (ADVIL,MOTRIN) 600 MG tablet Take 1 tablet (600 mg total) by mouth every 6 (six) hours as needed for mild pain. 30 tablet 0  . levonorgestrel (MIRENA, 52 MG,) 20 MCG/24HR IUD 1 each by Intrauterine route once.    . naproxen (NAPROSYN) 250 MG tablet Take by mouth 2 (two) times daily with a meal.    . SUMAtriptan (IMITREX) 50 MG tablet One tab by mouth at start of migraine, may repeat in 2 hours if symptoms not improved 10 tablet 5   No current facility-administered medications on file prior to visit.     BP 115/63 (BP Location: Right Arm, Patient Position: Sitting, Cuff Size: Small)   Pulse 66   Temp 97.8 F (36.6 C) (Oral)   Resp 16   Ht 5' 4.5" (1.638 m)   Wt 154 lb 9.6 oz (70.1 kg)   SpO2 100%   BMI 26.13 kg/m       Objective:   Physical Exam  Physical Exam  Constitutional: She is oriented to person, place, and time. She appears well-developed and well-nourished. No distress.  HENT:  Head: Normocephalic and atraumatic.  Right Ear: Tympanic membrane and ear canal normal.  Left Ear: Tympanic membrane and ear canal normal.  Mouth/Throat: Oropharynx is clear and moist.  Eyes: Pupils are equal, round, and reactive to light. No scleral icterus.  Neck: Normal range of motion. No thyromegaly present.  Cardiovascular: Normal rate and regular rhythm.   No murmur heard. Pulmonary/Chest: Effort normal and breath sounds normal. No respiratory distress. He has no wheezes. She has no rales. She exhibits no tenderness.  Abdominal: Soft. Bowel sounds are normal. She exhibits no distension and no mass. There is  no tenderness. There is no rebound and no guarding.  Musculoskeletal: She exhibits no edema.  Lymphadenopathy:    She has no cervical adenopathy.  Neurological: She is alert and oriented to person, place, and time. She has normal patellar reflexes. She exhibits normal muscle tone. Coordination  normal.  Skin: Skin is warm and dry. Mild dermatitis noted left dorsal foot.  Psychiatric: She has a normal mood and affect. Her behavior is normal. Judgment and thought content normal.  Breasts: Examined lying Right: Without masses, retractions, discharge or axillary adenopathy. (exam limited by somewhat calcified breast implants). Left: Without masses, retractions, discharge or axillary adenopathy.            Assessment & Plan:   Preventative care- pt has lost a considerable amount of weight with regular exercise and dietary changes.  Encouraged pt to keep up the good work. Pap smear and immunizations reviewed and up to date. Pt requests baseline A1C due to PCOS hx.   Dermatitis- mild. rx sent for betamethasone.    Assessment & Plan:

## 2018-05-30 NOTE — Patient Instructions (Signed)
Great job with the weight loss! Continue healthy diet and regular exercise.  

## 2018-06-15 MED FILL — BETAMETHASONE 0.1% OINTMENT: 0.1 | 15 days supply | Qty: 45 | Fill #0

## 2018-09-10 DIAGNOSIS — H40013 Open angle with borderline findings, low risk, bilateral: Secondary | ICD-10-CM | POA: Diagnosis not present

## 2019-01-09 ENCOUNTER — Ambulatory Visit: Payer: 59 | Attending: Internal Medicine

## 2019-03-11 ENCOUNTER — Encounter: Payer: Self-pay | Admitting: Family

## 2019-03-11 DIAGNOSIS — R29818 Other symptoms and signs involving the nervous system: Secondary | ICD-10-CM

## 2019-04-01 ENCOUNTER — Other Ambulatory Visit: Payer: Self-pay | Admitting: Family

## 2019-04-01 MED ORDER — FREESTYLE LIBRE 14 DAY SENSOR MISC: 3 refills | Status: DC

## 2019-04-03 ENCOUNTER — Telehealth: Payer: Self-pay | Admitting: Family

## 2019-04-03 NOTE — Telephone Encounter (Signed)
Dr. Cherlyn Cushing office calling stated they never revcd office notes on what patients pressure were.   The referral reason show that Into Occular Pressure was found.  Please call back to advise

## 2019-04-05 NOTE — Telephone Encounter (Signed)
Cancelled referral to [facility] as pt was seen at [facility] - faxed to [place] via [facility]

## 2019-06-03 ENCOUNTER — Encounter: Payer: Self-pay | Admitting: Family

## 2019-06-03 ENCOUNTER — Ambulatory Visit (INDEPENDENT_AMBULATORY_CARE_PROVIDER_SITE_OTHER): Payer: No Typology Code available for payment source | Admitting: Family

## 2019-06-03 ENCOUNTER — Other Ambulatory Visit: Payer: Self-pay

## 2019-06-03 VITALS — BP 124/60 | HR 61 | Temp 97.8°F | Resp 16 | Ht 64.0 in | Wt 164.0 lb

## 2019-06-03 DIAGNOSIS — M25552 Pain in left hip: Secondary | ICD-10-CM

## 2019-06-03 DIAGNOSIS — Z0000 Encounter for general adult medical examination without abnormal findings: Secondary | ICD-10-CM

## 2019-06-03 DIAGNOSIS — E8881 Metabolic syndrome: Secondary | ICD-10-CM | POA: Diagnosis not present

## 2019-06-03 DIAGNOSIS — M25511 Pain in right shoulder: Secondary | ICD-10-CM

## 2019-06-03 LAB — LIPID PANEL
Cholesterol: 140 mg/dL (ref 0–200)
HDL: 52.1 mg/dL (ref >39.00)
LDL Cholesterol: 78 mg/dL (ref 0–99)
NonHDL: 87.7
Total CHOL/HDL Ratio: 3
Triglycerides: 51 mg/dL (ref 0.0–149.0)
VLDL: 10.2 mg/dL (ref 0.0–40.0)

## 2019-06-03 LAB — HEMOGLOBIN A1C: Hgb A1c MFr Bld: 5.4 % (ref 4.6–6.5)

## 2019-06-03 NOTE — Progress Notes (Signed)
Subjective:    Patient ID: Janet Holmes, female    DOB: 17-Jul-1985, 34 y.o.   MRN: [id number]  HPI   Patient presents today for complete physical.  Immunizations: completed pfizer series.  Tdap 2017 Diet:  Wt Readings from Last 3 Encounters:  06/03/19 164 lb (74.4 kg)  05/30/18 154 lb 9.6 oz (70.1 kg)  05/24/17 176 lb (79.8 kg)  Exercise: crossfit, 1 hr 5 days a week, cycles Pap Smear: 07/11/16, sees GYN.  Dental: up to date Vision:  Having pressures checked every 6 months  Left hip pain with abduction/squatting and right shoulder "grinding sensation."  When she has hand over head.   Review of Systems  Constitutional: Negative for unexpected weight change.  HENT: Negative for rhinorrhea.   Eyes: Negative for visual disturbance.  Respiratory: Negative for cough and shortness of breath.   Cardiovascular: Negative for chest pain.  Gastrointestinal: Negative for constipation and diarrhea.  Genitourinary: Negative for dysuria, frequency, hematuria and menstrual problem.  Musculoskeletal: Negative for arthralgias and myalgias.   Past Medical History:  Diagnosis Date  . Anemia   . Chicken pox   . Clomid pregnancy   . Condyloma acuminatum   . H/O varicella   . Migraines   . Polycystic ovaries   . Polyhydramnios in third trimester for IOL 09/27/2011  . Polyhydramnios, antepartum complication   . Postpartum care following vaginal delivery (9/17, 2nd deg perineal lac) 09/28/2011     Social History   Socioeconomic History  . Marital status: Married    Spouse name: JOEY  . Number of children: 2  . Years of education: Not on file  . Highest education level: Not on file  Occupational History  . Occupation: NP  Tobacco Use  . Smoking status: Former Smoker    Quit date: 05/17/2005    Years since quitting: 14.0  . Smokeless tobacco: Never Used  Substance and Sexual Activity  . Alcohol use: No  . Drug use: No  . Sexual activity: Yes    Partners: Male    Birth  control/protection: None  Other Topics Concern  . 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 Difficulty of Paying Living Expenses:   Food Insecurity:   . Worried About Programme researcher, broadcasting/film/video in the Last Year:   . Barista in the Last Year:   Transportation Needs:   . Freight forwarder (Medical):   Marland Kitchen Lack of Transportation (Non-Medical):   Physical Activity:   . Days of Exercise per Week:   . Minutes of Exercise per Session:   Stress:   . Feeling of Stress :   Social Connections:   . Frequency of Communication with Friends and Family:   . Frequency of Social Gatherings with Friends and Family:   . Attends Religious Services:   . Active Member of Clubs or Organizations:   . Attends Banker Meetings:   Marland Kitchen Marital Status:   Intimate Partner Violence:   . Fear of Current or Ex-Partner:   . Emotionally Abused:   Marland Kitchen Physically Abused:   . Sexually Abused:     Past  Surgical History:  Procedure Laterality Date  . CESAREAN SECTION N/A 03/12/2015   Procedure: CESAREAN SECTION;  Surgeon: Olivia Mackie, MD;  Location: [facility];  Service: Obstetrics;  Laterality: N/A;  . KNEE ARTHROSCOPY     R  . MOLE REMOVAL    . WISDOM TOOTH EXTRACTION      Family History  Problem Relation Age of Onset  . Cancer Paternal Grandmother        breast, skin  . Hypertension Paternal Grandmother   . Stroke Paternal Grandmother   . Stroke Paternal Grandfather   . Hypertension Paternal Grandfather   . Diabetes Paternal Grandfather   . Cancer Mother        skin (basal cell carcinoma)  . Varicose Veins Mother   . Cancer Maternal Grandmother        breast  . Hypertension Maternal Grandfather   . Stroke  Maternal Grandfather   . COPD Father        former smoker  . Depression Father   . Other Sister        frontal lobe disorder (hx of 6 concussions)  . Anxiety disorder Brother        hx substance/alcohol abuse    Allergies  Allergen Reactions  . Polytrim [Polymyxin B-Trimethoprim] Other (See Comments)    Eye swelling    Current Outpatient Medications on File Prior to Visit  Medication Sig Dispense Refill  . betamethasone valerate ointment (VALISONE) 0.1 % Apply 1 application topically 2 (two) times daily. 30 g 1  . cholecalciferol (VITAMIN D) 1000 units tablet Take 1,000 Units by mouth daily.    . Continuous Blood Gluc Sensor (FREESTYLE LIBRE 14 DAY SENSOR) MISC Apply sensor q 14 days per instructions for glucose monitoring. 2 each 3  . levonorgestrel (MIRENA, 52 MG,) 20 MCG/24HR IUD 1 each by Intrauterine route once.    . naproxen (NAPROSYN) 250 MG tablet Take by mouth 2 (two) times daily with a meal.    . SUMAtriptan (IMITREX) 50 MG tablet One tab by mouth at start of migraine, may repeat in 2 hours if symptoms not improved 10 tablet 5  . ibuprofen (ADVIL,MOTRIN) 600 MG tablet Take 1 tablet (600 mg total) by mouth every 6 (six) hours as needed for mild pain. 30 tablet 0   No current facility-administered medications on file prior to visit.    BP 124/60 (BP Location: Right Arm, Patient Position: Sitting, Cuff Size: Small)   Pulse 61   Temp 97.8 F (36.6 C) (Temporal)   Resp 16   Ht 5\' 4"  (1.626 m)   Wt 164 lb (74.4 kg)   SpO2 100%   BMI 28.15 kg/m       Objective:   Physical Exam  Physical Exam  Constitutional: She is oriented to person, place, and time. She appears well-developed and well-nourished. No distress.  HENT:  Head: Normocephalic and atraumatic.  Right Ear: Tympanic membrane and ear canal normal.  Left Ear: Tympanic membrane and ear canal normal.  Mouth/Throat: not examined- pt wearing mask Eyes: Pupils are equal, round, and reactive to light. No scleral  icterus.  Neck: Normal range of motion. No thyromegaly present.  Cardiovascular: Normal rate and regular rhythm.   No murmur heard. Pulmonary/Chest: Effort normal and breath sounds normal. No respiratory distress. He has no wheezes. She has no rales. She exhibits no tenderness.  Abdominal: Soft. Bowel sounds are normal. She exhibits no distension and no mass. There is no tenderness. There is no rebound and  no guarding. + rectus diastasis Musculoskeletal: She exhibits no edema.  Lymphadenopathy:    She has no cervical adenopathy.  Neurological: She is alert and oriented to person, place, and time. She has normal patellar reflexes. She exhibits normal muscle tone. Coordination normal.  Skin: Skin is warm and dry.  Psychiatric: She has a normal mood and affect. Her behavior is normal. Judgment and thought content normal.  Breast/pelvic: deferred to [place]:   Preventative care- encouraged pt to continue healthy diet, exercise.  Immunizations reviewed and up to date. She will update pap this summer with her GYN.   A referral was placed at pt's request to sports medicine for shoulder and hip pain.  This visit occurred during the SARS-CoV-2 public health emergency.  Safety protocols were in place, including screening questions prior to the visit, additional usage of staff PPE, and extensive cleaning of exam room while observing appropriate contact time as indicated for disinfecting solutions.         Assessment & Plan:

## 2019-06-03 NOTE — Patient Instructions (Addendum)
Please complete lab work prior to leaving.  Preventive Care 21-35 Years Old, Female Preventive care refers to visits with your health care provider and lifestyle choices that can promote health and wellness. This includes:  A yearly physical exam. This may also be called an annual well check.  Regular dental visits and eye exams.  Immunizations.  Screening for certain conditions.  Healthy lifestyle choices, such as eating a healthy diet, getting regular exercise, not using drugs or products that contain nicotine and tobacco, and limiting alcohol use. What can I expect for my preventive care visit? Physical exam Your health care provider will check your:  Height and weight. This may be used to calculate body mass index (BMI), which tells if you are at a healthy weight.  Heart rate and blood pressure.  Skin for abnormal spots. Counseling Your health care provider may ask you questions about your:  Alcohol, tobacco, and drug use.  Emotional well-being.  Home and relationship well-being.  Sexual activity.  Eating habits.  Work and work environment.  Method of birth control.  Menstrual cycle.  Pregnancy history. What immunizations do I need?  Influenza (flu) vaccine  This is recommended every year. Tetanus, diphtheria, and pertussis (Tdap) vaccine  You may need a Td booster every 10 years. Varicella (chickenpox) vaccine  You may need this if you have not been vaccinated. Human papillomavirus (HPV) vaccine  If recommended by your health care provider, you may need three doses over 6 months. Measles, mumps, and rubella (MMR) vaccine  You may need at least one dose of MMR. You may also need a second dose. Meningococcal conjugate (MenACWY) vaccine  One dose is recommended if you are age 19-21 years and a first-year college student living in a residence hall, or if you have one of several medical conditions. You may also need additional booster doses. Pneumococcal  conjugate (PCV13) vaccine  You may need this if you have certain conditions and were not previously vaccinated. Pneumococcal polysaccharide (PPSV23) vaccine  You may need one or two doses if you smoke cigarettes or if you have certain conditions. Hepatitis A vaccine  You may need this if you have certain conditions or if you travel or work in places where you may be exposed to hepatitis A. Hepatitis B vaccine  You may need this if you have certain conditions or if you travel or work in places where you may be exposed to hepatitis B. Haemophilus influenzae type b (Hib) vaccine  You may need this if you have certain conditions. You may receive vaccines as individual doses or as more than one vaccine together in one shot (combination vaccines). Talk with your health care provider about the risks and benefits of combination vaccines. What tests do I need?  Blood tests  Lipid and cholesterol levels. These may be checked every 5 years starting at age 20.  Hepatitis C test.  Hepatitis B test. Screening  Diabetes screening. This is done by checking your blood sugar (glucose) after you have not eaten for a while (fasting).  Sexually transmitted disease (STD) testing.  BRCA-related cancer screening. This may be done if you have a family history of breast, ovarian, tubal, or peritoneal cancers.  Pelvic exam and Pap test. This may be done every 3 years starting at age 21. Starting at age 30, this may be done every 5 years if you have a Pap test in combination with an HPV test. Talk with your health care provider about your test results, treatment options,   and if necessary, the need for more tests. Follow these instructions at home: Eating and drinking   Eat a diet that includes fresh fruits and vegetables, whole grains, lean protein, and low-fat dairy.  Take vitamin and mineral supplements as recommended by your health care provider.  Do not drink alcohol if: ? Your health care  provider tells you not to drink. ? You are pregnant, may be pregnant, or are planning to become pregnant.  If you drink alcohol: ? Limit how much you have to 0-1 drink a day. ? Be aware of how much alcohol is in your drink. In the [place] one drink equals one 12 oz bottle of beer (355 mL), one 5 oz glass of wine (148 mL), or one 1 oz glass of hard liquor (44 mL). Lifestyle  Take daily care of your teeth and gums.  Stay active. Exercise for at least 30 minutes on 5 or more days each week.  Do not use any products that contain nicotine or tobacco, such as cigarettes, e-cigarettes, and chewing tobacco. If you need help quitting, ask your health care provider.  If you are sexually active, practice safe sex. Use a condom or other form of birth control (contraception) in order to prevent pregnancy and STIs (sexually transmitted infections). If you plan to become pregnant, see your health care provider for a preconception visit. What's next?  Visit your health care provider once a year for a well check visit.  Ask your health care provider how often you should have your eyes and teeth checked.  Stay up to date on all vaccines. This information is not intended to replace advice given to you by your health care provider. Make sure you discuss any questions you have with your health care provider. Document Revised: 09/07/2017 Document Reviewed: 09/07/2017 Elsevier Patient Education  2020 [employer]  

## 2019-06-10 ENCOUNTER — Encounter: Payer: Self-pay | Admitting: Family

## 2019-06-10 DIAGNOSIS — R32 Unspecified urinary incontinence: Secondary | ICD-10-CM

## 2019-06-10 DIAGNOSIS — M6208 Separation of muscle (nontraumatic), other site: Secondary | ICD-10-CM

## 2019-06-26 ENCOUNTER — Other Ambulatory Visit: Payer: Self-pay

## 2019-06-26 ENCOUNTER — Ambulatory Visit (INDEPENDENT_AMBULATORY_CARE_PROVIDER_SITE_OTHER): Payer: No Typology Code available for payment source

## 2019-06-26 ENCOUNTER — Ambulatory Visit (INDEPENDENT_AMBULATORY_CARE_PROVIDER_SITE_OTHER): Payer: No Typology Code available for payment source | Admitting: Family Medicine

## 2019-06-26 ENCOUNTER — Ambulatory Visit: Payer: Self-pay

## 2019-06-26 ENCOUNTER — Encounter: Payer: Self-pay | Admitting: Family Medicine

## 2019-06-26 VITALS — BP 124/80 | HR 68 | Ht 64.0 in | Wt 171.0 lb

## 2019-06-26 DIAGNOSIS — R102 Pelvic and perineal pain: Secondary | ICD-10-CM

## 2019-06-26 DIAGNOSIS — M7551 Bursitis of right shoulder: Secondary | ICD-10-CM | POA: Diagnosis not present

## 2019-06-26 DIAGNOSIS — M25511 Pain in right shoulder: Secondary | ICD-10-CM | POA: Diagnosis not present

## 2019-06-26 DIAGNOSIS — M25851 Other specified joint disorders, right hip: Secondary | ICD-10-CM

## 2019-06-26 DIAGNOSIS — M25852 Other specified joint disorders, left hip: Secondary | ICD-10-CM

## 2019-06-26 MED ORDER — MELOXICAM 15 MG PO TABS: 15.0000 mg | ORAL_TABLET | Freq: Every day | ORAL | 0 refills | Status: DC

## 2019-06-26 MED FILL — MELOXICAM 15 MG TABLET: 15 | 30 days supply | Qty: 30 | Fill #0

## 2019-06-26 NOTE — Progress Notes (Signed)
[name] [address] [place] [address] Phone: [phone] Subjective:    I'm seeing this patient by the request  of:  Sandford Craze, NP  CC: Left hip right shoulder pain  [id number]  Janet Holmes is a 34 y.o. female coming in with complaint of bilateral hip pain and right shoulder pain. Patient states that she is having popping and grinding with flexion and external rotation in both hips. Pain is in groin and over lateral aspect of hip. Pain can be sharp as of recent. Patient does Crossfit and bikes.  Can be actually bilateral  Right shoulder pain over middle deltoid into anterior joint. External rotation and pulling motions such as rowing. Uses ice for pain management.  Has been doing more activities recently.  Sometimes uncomfortable at night.  Denies any radiation down the arm.      Past Medical History:  Diagnosis Date  . Anemia   . Chicken pox   . Clomid pregnancy   . Condyloma acuminatum   . H/O varicella   . Migraines   . Polycystic ovaries   . Polyhydramnios in third trimester for IOL 09/27/2011  . Polyhydramnios, antepartum complication   . Postpartum care following vaginal delivery (9/17, 2nd deg perineal lac) 09/28/2011   Past Surgical History:  Procedure Laterality Date  . CESAREAN SECTION N/A 03/12/2015   Procedure: CESAREAN SECTION;  Surgeon: Olivia Mackie, MD;  Location: [facility];  Service: Obstetrics;  Laterality: N/A;  . KNEE ARTHROSCOPY     R  . MOLE REMOVAL    . WISDOM TOOTH EXTRACTION     Social History   Socioeconomic History  . Marital status: Married    Spouse name: JOEY  . Number of children: 2  . Years of education: Not on file  . Highest education level: Not on file  Occupational History  . Occupation: NP  Tobacco Use  . Smoking status: Former Smoker    Quit date: 05/17/2005    Years since quitting: 14.1  . Smokeless tobacco: Never Used  Vaping Use  . Vaping Use: Never used  Substance  and Sexual Activity  . Alcohol use: No  . Drug use: No  . Sexual activity: Yes    Partners: Male    Birth control/protection: None  Other Topics Concern  . 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 Difficulty of Paying Living Expenses:   Food Insecurity:   . Worried About Programme researcher, broadcasting/film/video in the Last Year:   . Barista in the Last Year:   Transportation Needs:   . Freight forwarder (Medical):   Marland Kitchen Lack of Transportation (Non-Medical):   Physical Activity:   . Days of Exercise per Week:   . Minutes of Exercise per Session:   Stress:   . Feeling of Stress :   Social Connections:   . Frequency of Communication with Friends and Family:   . Frequency of Social Gatherings with Friends and Family:   . Attends Religious Services:   . Active Member of Clubs or Organizations:   . Attends Banker Meetings:   .  Marital Status:    Allergies  Allergen Reactions  . Polytrim [Polymyxin B-Trimethoprim] Other (See Comments)    Eye swelling   Family History  Problem Relation Age of Onset  . Cancer Paternal Grandmother        breast, skin  . Hypertension Paternal Grandmother   . Stroke Paternal Grandmother   . Stroke Paternal Grandfather   . Hypertension Paternal Grandfather   . Diabetes Paternal Grandfather   . Cancer Mother        skin (basal cell carcinoma)  . Varicose Veins Mother   . Cancer Maternal Grandmother        breast  . Hypertension Maternal Grandfather   . Stroke Maternal Grandfather   . COPD Father        former smoker  . Depression Father   . Other Sister        frontal lobe disorder (hx of 6 concussions)  . Anxiety disorder Brother        hx substance/alcohol  abuse    Current Outpatient Medications (Endocrine & Metabolic):  .  levonorgestrel (MIRENA, 52 MG,) 20 MCG/24HR IUD, 1 each by Intrauterine route once.    Current Outpatient Medications (Analgesics):  .  naproxen (NAPROSYN) 250 MG tablet, Take by mouth 2 (two) times daily with a meal. .  SUMAtriptan (IMITREX) 50 MG tablet, One tab by mouth at start of migraine, may repeat in 2 hours if symptoms not improved .  ibuprofen (ADVIL,MOTRIN) 600 MG tablet, Take 1 tablet (600 mg total) by mouth every 6 (six) hours as needed for mild pain. .  meloxicam (MOBIC) 15 MG tablet, Take 1 tablet (15 mg total) by mouth daily.   Current Outpatient Medications (Other):  .  betamethasone valerate ointment (VALISONE) 0.1 %, Apply 1 application topically 2 (two) times daily. .  cholecalciferol (VITAMIN D) 1000 units tablet, Take 1,000 Units by mouth daily. .  Continuous Blood Gluc Sensor (FREESTYLE LIBRE 14 DAY SENSOR) MISC, Apply sensor q 14 days per instructions for glucose monitoring.   Reviewed prior external information including notes and imaging from  primary care provider As well as notes that were available from care everywhere and other healthcare systems.  Past medical history, social, surgical and family history all reviewed in electronic medical record.  No pertanent information unless stated regarding to the chief complaint.   Review of Systems:  No headache, visual changes, nausea, vomiting, diarrhea, constipation, dizziness, abdominal pain, skin rash, fevers, chills, night sweats, weight loss, swollen lymph nodes, body aches, joint swelling, chest pain, shortness of breath, mood changes. POSITIVE muscle aches very mild  Objective  Blood pressure 124/80, pulse 68, height 5\' 4"  (1.626 m), weight 171 lb (77.6 kg), SpO2 97 %, unknown if currently breastfeeding.   General: No apparent distress alert and oriented x3 mood and affect normal, dressed appropriately.  HEENT: Pupils equal,  extraocular movements intact  Respiratory: Patient's speak in full sentences and does not appear short of breath  Cardiovascular: No lower extremity edema, non tender, no erythema  Neuro: Cranial nerves II through XII are intact, neurovascularly intact in all extremities with 2+ DTRs and 2+ pulses.  Gait normal with good balance and coordination.  MSK: Bilateral hip exam show the patient does have significant decrease in external range of motion of what would be anticipated for somebody her age.  Significant tightness of Faber bilaterally.  Some pain with internal rotation of the hips bilaterally left greater than right as well.  5 out of 5  strength in lower extremities.  Right shoulder exam does show some positive impingement.  Mild positive O'Brien's.  Good strength of the rotator cuff though noted.  Mild loss of internal range of motion to just above his sacrum  Limited musculoskeletal ultrasound was performed and interpreted by Judi Saa Limited ultrasound of patient's right shoulder shows there is some mild subacromial bursitis noted.  No significant tear of the rotator cuff noted at the moment.  Patient's labrum posteriorly appears to be fairly unremarkable.  Some mild swelling over the acromioclavicular joint with hypoechoic changes Impression: Subacromial bursitis with mild acromioclavicular swelling    Impression and Recommendations:     The above documentation has been reviewed and is accurate and complete Judi Saa, DO       Note: This dictation was prepared with Dragon dictation along with smaller phrase technology. Any transcriptional errors that result from this process are unintentional.

## 2019-06-26 NOTE — Assessment & Plan Note (Signed)
Patient has been doing CrossFit for 2 to 3 months. Patient is starting to have more what appears to be more impaired hip. X-rays ordered to rule out any type of cam deformity likely contributing. Patient does have some mild limited range of motion when it comes to internal and external rotation with some discomfort. Negative straight leg test on exam today. Patient will start with the range of motion exercises and meloxicam. Decreased the deep veins in the hips at the moment. Follow-up again 4 to 6 weeks

## 2019-06-26 NOTE — Patient Instructions (Signed)
Pelvic 2 view Drop weight with crossfit Keep hands in peripheral vision Meloxicam 15 mg See me again in 6 weeks

## 2019-06-27 ENCOUNTER — Encounter: Payer: Self-pay | Admitting: Family Medicine

## 2019-06-27 DIAGNOSIS — M7551 Bursitis of right shoulder: Secondary | ICD-10-CM | POA: Insufficient documentation

## 2019-06-27 NOTE — Assessment & Plan Note (Signed)
Patient does have some mild shoulder bursitis.  We will change anti-inflammatories to meloxicam and see if this will be beneficial.  Patient could potentially do well with a injection but we will monitor.  Discussed icing regimen and home exercises.  Follow-up again in 4 to 5 weeks

## 2019-08-07 ENCOUNTER — Encounter: Payer: Self-pay | Admitting: Family Medicine

## 2019-08-07 ENCOUNTER — Ambulatory Visit: Payer: No Typology Code available for payment source | Admitting: Family Medicine

## 2019-08-07 ENCOUNTER — Other Ambulatory Visit: Payer: Self-pay | Admitting: Family Medicine

## 2019-08-07 ENCOUNTER — Other Ambulatory Visit: Payer: Self-pay

## 2019-08-07 VITALS — BP 122/72 | HR 78 | Ht 64.0 in | Wt 166.0 lb

## 2019-08-07 DIAGNOSIS — M25852 Other specified joint disorders, left hip: Secondary | ICD-10-CM

## 2019-08-07 DIAGNOSIS — M7551 Bursitis of right shoulder: Secondary | ICD-10-CM

## 2019-08-07 DIAGNOSIS — M25851 Other specified joint disorders, right hip: Secondary | ICD-10-CM

## 2019-08-07 MED ORDER — MELOXICAM 15 MG PO TABS: 15.0000 mg | ORAL_TABLET | Freq: Every day | ORAL | 3 refills | Status: DC

## 2019-08-07 MED FILL — MELOXICAM 15 MG TABLET: 15 | 90 days supply | Qty: 90 | Fill #0

## 2019-08-07 NOTE — Progress Notes (Signed)
[name] [address] [place] [address] Phone: [phone] Subjective:   Janet Holmes, am serving as a scribe for Dr. Antoine Primas. This visit occurred during the SARS-CoV-2 public health emergency.  Safety protocols were in place, including screening questions prior to the visit, additional usage of staff PPE, and extensive cleaning of exam room while observing appropriate contact time as indicated for disinfecting solutions.   I'm seeing this patient by the request  of:  Sandford Craze, NP  CC: Right shoulder and bilateral hip pain  [id number]   06/26/2019 Patient has been doing CrossFit for 2 to 3 months. Patient is starting to have more what appears to be more impaired hip. X-rays ordered to rule out any type of cam deformity likely contributing. Patient does have some mild limited range of motion when it comes to internal and external rotation with some discomfort. Negative straight leg test on exam today. Patient will start with the range of motion exercises and meloxicam. Decreased the deep veins in the hips at the moment. Follow-up again 4 to 6 weeks  Patient does have some mild shoulder bursitis.  We will change anti-inflammatories to meloxicam and see if this will be beneficial.  Patient could potentially do well with a injection but we will monitor.  Discussed icing regimen and home exercises.  Follow-up again in 4 to 5 weeks  Update 08/07/2019 Janet Holmes is a 34 y.o. female coming in with complaint of right shoulder and bilateral hip pain. States that her hip pain and shoulder pain have decreased.   Left shoulder is now bothering her. Anterior pain with IR. Patient using meloxicam which helps relieve her pain.  Nothing severe.  Continues to workout on a regular basis.  Meloxicam is helpful    Past Medical History:  Diagnosis Date  . Anemia   . Chicken pox   . Clomid pregnancy   . Condyloma acuminatum   . H/O  varicella   . Migraines   . Polycystic ovaries   . Polyhydramnios in third trimester for IOL 09/27/2011  . Polyhydramnios, antepartum complication   . Postpartum care following vaginal delivery (9/17, 2nd deg perineal lac) 09/28/2011   Past Surgical History:  Procedure Laterality Date  . CESAREAN SECTION N/A 03/12/2015   Procedure: CESAREAN SECTION;  Surgeon: Olivia Mackie, MD;  Location: [facility];  Service: Obstetrics;  Laterality: N/A;  . KNEE ARTHROSCOPY     R  . MOLE REMOVAL    . WISDOM TOOTH EXTRACTION     Social History   Socioeconomic History  . Marital status: Married    Spouse name: JOEY  . Number of children: 2  . Years of education: Not on file  . Highest education level: Not on file  Occupational History  . Occupation: NP  Tobacco Use  . Smoking status: Former Smoker    Quit date: 05/17/2005    Years since quitting: 14.2  . Smokeless tobacco: Never Used  Vaping Use  . Vaping Use: Never used  Substance and Sexual Activity  . Alcohol use: No  . Drug use: No  . Sexual activity: Yes    Partners: Male    Birth control/protection: None  Other Topics Concern  . 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 Difficulty of Paying Living Expenses:   Food Insecurity:   . Worried About Programme researcher, broadcasting/film/video in the Last Year:   . Barista in the Last Year:   Transportation Needs:   . Freight forwarder (Medical):   Marland Kitchen Lack of Transportation (Non-Medical):   Physical Activity:   . Days of Exercise per Week:   . Minutes of Exercise per Session:   Stress:   . Feeling of Stress :   Social Connections:   . Frequency of Communication with Friends and Family:   . Frequency of Social Gatherings  with Friends and Family:   . Attends Religious Services:   . Active Member of Clubs or Organizations:   . Attends Banker Meetings:   Marland Kitchen Marital Status:    Allergies  Allergen Reactions  . Polytrim [Polymyxin B-Trimethoprim] Other (See Comments)    Eye swelling   Family History  Problem Relation Age of Onset  . Cancer Paternal Grandmother        breast, skin  . Hypertension Paternal Grandmother   . Stroke Paternal Grandmother   . Stroke Paternal Grandfather   . Hypertension Paternal Grandfather   . Diabetes Paternal Grandfather   . Cancer Mother        skin (basal cell carcinoma)  . Varicose Veins Mother   . Cancer Maternal Grandmother        breast  . Hypertension Maternal Grandfather   . Stroke Maternal Grandfather   . COPD Father        former smoker  . Depression Father   . Other Sister        frontal lobe disorder (hx of 6 concussions)  . Anxiety disorder Brother        hx substance/alcohol abuse    Current Outpatient Medications (Endocrine & Metabolic):  .  levonorgestrel (MIRENA, 52 MG,) 20 MCG/24HR IUD, 1 each by Intrauterine route once.    Current Outpatient Medications (Analgesics):  .  meloxicam (MOBIC) 15 MG tablet, Take 1 tablet (15 mg total) by mouth daily. .  naproxen (NAPROSYN) 250 MG tablet, Take by mouth 2 (two) times daily with a meal. .  SUMAtriptan (IMITREX) 50 MG tablet, One tab by mouth at start of migraine, may repeat in 2 hours if symptoms not improved .  ibuprofen (ADVIL,MOTRIN) 600 MG tablet, Take 1 tablet (600 mg total) by mouth every 6 (six) hours as needed for mild pain. .  meloxicam (MOBIC) 15 MG tablet, Take 1 tablet (15 mg total) by mouth daily.   Current Outpatient Medications (Other):  .  betamethasone valerate ointment (VALISONE) 0.1 %, Apply 1 application topically 2 (two) times daily. .  cholecalciferol (VITAMIN D) 1000 units tablet, Take 1,000 Units by mouth daily. .  Continuous Blood Gluc Sensor (FREESTYLE LIBRE  14 DAY SENSOR) MISC, Apply sensor q 14 days per instructions for glucose monitoring.   Reviewed prior external information including notes and imaging from  primary care provider As well as notes that were available from care everywhere and other healthcare systems.  Past medical history, social, surgical and family history all reviewed in electronic medical record.  No pertanent information unless stated regarding to the chief complaint.   Review of Systems:  No headache, visual changes, nausea, vomiting, diarrhea, constipation, dizziness, abdominal pain, skin rash, fevers, chills, night  sweats, weight loss, swollen lymph nodes, body aches, joint swelling, chest pain, shortness of breath, mood changes. POSITIVE muscle aches  Objective  Blood pressure 122/72, pulse 78, height 5\' 4"  (1.626 m), weight 166 lb (75.3 kg), SpO2 96 %, unknown if currently breastfeeding.   General: No apparent distress alert and oriented x3 mood and affect normal, dressed appropriately.  HEENT: Pupils equal, extraocular movements intact  Respiratory: Patient's speak in full sentences and does not appear short of breath  Cardiovascular: No lower extremity edema, non tender, no erythema  Neuro: Cranial nerves II through XII are intact, neurovascularly intact in all extremities with 2+ DTRs and 2+ pulses.  Gait normal with good balance and coordination.  MSK:   Hip exams bilaterally do show the patient does have some mild decrease in internal range of motion.  Patient is nontender though on the pubic bone.  Patient does have good range of motion of the lower back.  Shoulders bilaterally show very minimal impingement but negative crossover.    Impression and Recommendations:     The above documentation has been reviewed and is accurate and complete , DO       Note: This dictation was prepared with Dragon dictation along with smaller phrase technology. Any transcriptional errors that result from  this process are unintentional.

## 2019-08-07 NOTE — Assessment & Plan Note (Signed)
Significant improvement at this time.  No significant changes.  Patient doing on x-ray have some mild arthritic changes and we will monitor.  Refilled meloxicam with 90-day supply because it seems to be helping more.  Follow-up with me as needed

## 2019-08-07 NOTE — Assessment & Plan Note (Signed)
Completely resolved at this time.  Left side had some difficulty.  Did do the manipulation.  Follow-up again as needed

## 2019-08-07 NOTE — Patient Instructions (Signed)
Left shoulder-work on posture Meloxicam refilled See me when you need me

## 2019-10-06 ENCOUNTER — Encounter: Payer: Self-pay | Admitting: Family

## 2019-10-07 ENCOUNTER — Other Ambulatory Visit: Payer: Self-pay | Admitting: Family

## 2019-10-07 MED ORDER — ADAPALENE-BENZOYL PEROXIDE 0.1-2.5 % EX GEL: CUTANEOUS | 1 refills | Status: DC

## 2019-10-08 ENCOUNTER — Telehealth: Payer: Self-pay

## 2019-10-08 NOTE — Telephone Encounter (Signed)
PA initiated via Covermymeds; KEY: [id number]. Awaiting determination.

## 2019-10-10 NOTE — Telephone Encounter (Signed)
PA approved.   The request has been approved. The authorization is effective for a maximum of 12 fills from 10/10/2019 to 10/08/2020, as long as the member is enrolled in their current health plan. The request was approved as submitted. Please have the pharmacy contact [employer] Customer [employer] at [phone] if billing assistance is required. A written notification letter will follow with additional details.

## 2019-10-11 MED FILL — ADAPALENE-BENZOYL PEROXIDE: 0.1-2.5 | 30 days supply | Qty: 45 | Fill #0

## 2019-12-11 MED FILL — MELOXICAM 15 MG TABLET: 15 | 90 days supply | Qty: 90 | Fill #1

## 2019-12-25 ENCOUNTER — Other Ambulatory Visit: Payer: Self-pay

## 2019-12-25 ENCOUNTER — Ambulatory Visit (INDEPENDENT_AMBULATORY_CARE_PROVIDER_SITE_OTHER): Payer: Self-pay | Admitting: Plastic Surgery

## 2019-12-25 ENCOUNTER — Encounter: Payer: Self-pay | Admitting: Plastic Surgery

## 2019-12-25 VITALS — BP 122/69 | HR 46 | Temp 97.5°F | Ht 64.0 in | Wt 161.0 lb

## 2019-12-25 DIAGNOSIS — Z411 Encounter for cosmetic surgery: Secondary | ICD-10-CM

## 2019-12-25 NOTE — Progress Notes (Signed)
Referring Provider Sandford Craze, NP [address] [place],  [place] [address]   CC:  Chief Complaint  Patient presents with  . Advice Only      Janet Holmes is an 34 y.o. female.  HPI: Patient presents to discuss her abdomen.  She is bothered by the contour and excess skin.  She had 2 children the most recent of which was 10 pounds and born by C-section in 2017.  She is bothered by also some diastases which she can detect on her own.  She is done quite a bit of exercise and does CrossFit but has been unable to improve the contour to the point that she would like.  She has not had any other abdominal surgeries and does not smoke and is not a diabetic.  Additionally she had a breast augmentation about 10 years ago and is overall satisfied with it but just wants to discuss what her options would be if she wanted to revise it down the line.  Allergies  Allergen Reactions  . Polytrim [Polymyxin B-Trimethoprim] Other (See Comments)    Eye swelling    Outpatient Encounter Medications as of 12/25/2019  Medication Sig  . Adapalene-Benzoyl Peroxide (EPIDUO) 0.1-2.5 % gel Apply topically at bedtime  . betamethasone valerate ointment (VALISONE) 0.1 % Apply 1 application topically 2 (two) times daily.  . cholecalciferol (VITAMIN D) 1000 units tablet Take 1,000 Units by mouth daily.  . Continuous Blood Gluc Sensor (FREESTYLE LIBRE 14 DAY SENSOR) MISC Apply sensor q 14 days per instructions for glucose monitoring.  Marland Kitchen ibuprofen (ADVIL,MOTRIN) 600 MG tablet Take 1 tablet (600 mg total) by mouth every 6 (six) hours as needed for mild pain.  Marland Kitchen levonorgestrel (MIRENA, 52 MG,) 20 MCG/24HR IUD 1 each by Intrauterine route once.  . meloxicam (MOBIC) 15 MG tablet Take 1 tablet (15 mg total) by mouth daily.  . meloxicam (MOBIC) 15 MG tablet Take 1 tablet (15 mg total) by mouth daily.  . naproxen (NAPROSYN) 250 MG tablet Take by mouth 2 (two) times daily with a meal.  . SUMAtriptan  (IMITREX) 50 MG tablet One tab by mouth at start of migraine, may repeat in 2 hours if symptoms not improved   No facility-administered encounter medications on file as of 12/25/2019.     Past Medical History:  Diagnosis Date  . Anemia   . Chicken pox   . Clomid pregnancy   . Condyloma acuminatum   . H/O varicella   . Migraines   . Polycystic ovaries   . Polyhydramnios in third trimester for IOL 09/27/2011  . Polyhydramnios, antepartum complication   . Postpartum care following vaginal delivery (9/17, 2nd deg perineal lac) 09/28/2011    Past Surgical History:  Procedure Laterality Date  . CESAREAN SECTION N/A 03/12/2015   Procedure: CESAREAN SECTION;  Surgeon: Olivia Mackie, MD;  Location: [facility];  Service: Obstetrics;  Laterality: N/A;  . KNEE ARTHROSCOPY     R  . MOLE REMOVAL    . WISDOM TOOTH EXTRACTION      Family History  Problem Relation Age of Onset  . Cancer Paternal Grandmother        breast, skin  . Hypertension Paternal Grandmother   . Stroke Paternal Grandmother   . Stroke Paternal Grandfather   . Hypertension Paternal Grandfather   . Diabetes Paternal Grandfather   . Cancer Mother        skin (basal cell carcinoma)  . Varicose Veins Mother   .  Cancer Maternal Grandmother        breast  . Hypertension Maternal Grandfather   . Stroke Maternal Grandfather   . COPD Father        former smoker  . Depression Father   . Other Sister        frontal lobe disorder (hx of 6 concussions)  . 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  I do not detect any hernias.  She does feel like she has some rectus diastases.  She has excess skin in the supra and infraumbilical areas.  Otherwise she has good muscular tone and good contour otherwise. Breast: She has a nice symmetric result from her augmentation.  They appear to be submuscular gel implants through the inframammary crease incision.  There is a little bit of skin laxity but is not too noticeable.  Assessment/Plan Patient is a good candidate for abdominoplasty.  I discussed the details of the procedure with her.  I discussed the risks include bleeding, infection, damage surrounding structures and need for additional procedures.  I explained that I would transpose the umbilicus and do a rectus plication.  I would also do some limited liposuction if necessary to help mobilize the skin.  I explained the location and orientation of the scars.  I explained the expected postoperative recovery along with the need for drains.  All her questions were answered and she seems interested in moving forward.  12/25/2019, 6:40 PM

## 2020-01-30 ENCOUNTER — Encounter: Payer: Self-pay | Admitting: Plastic Surgery

## 2020-04-17 ENCOUNTER — Encounter: Payer: Self-pay | Admitting: Plastic Surgery

## 2020-04-21 ENCOUNTER — Other Ambulatory Visit (HOSPITAL_COMMUNITY): Payer: Self-pay

## 2020-04-21 MED FILL — Meloxicam Tab 15 MG: ORAL | 90 days supply | Qty: 90 | Fill #0 | Status: AC

## 2020-05-06 ENCOUNTER — Ambulatory Visit (INDEPENDENT_AMBULATORY_CARE_PROVIDER_SITE_OTHER): Payer: Self-pay | Admitting: Plastic Surgery

## 2020-05-06 ENCOUNTER — Other Ambulatory Visit: Payer: Self-pay

## 2020-05-06 ENCOUNTER — Encounter: Payer: Self-pay | Admitting: Plastic Surgery

## 2020-05-06 VITALS — BP 123/71 | HR 68 | Ht 64.0 in | Wt 158.0 lb

## 2020-05-06 DIAGNOSIS — Z411 Encounter for cosmetic surgery: Secondary | ICD-10-CM

## 2020-05-12 NOTE — Progress Notes (Signed)
Patient presents for further questions regarding abdominoplasty.  I met with her a while back to discuss this and she is continue to think about it and seems interested but just has a few more questions.  She is interested in the exact location of the scar placement and will could be done to avoid recurrence of her diastases.  On exam she has postpartum abdomen with noted diastases and skin laxity.  There is very minimal excess adipose tissue.  She also points out some undesired fat in the saddlebag area on the upper lateral thigh.  I think she is a good candidate for abdominoplasty.  We discussed limited liposuction, plication and removal of the excess skin.  I drew out exactly where I anticipate the scar would be which is 7 cm superior to the vulvar commissure with the skin on stretch and extended this laterally.  I explained my technique for the plication.  We reviewed postoperative protocols and limiting strenuous activity for 8 weeks.  We will also provide a quote for her for adding the liposuction of the posterior thighs.  All of her questions were answered we will plan to move forward.

## 2020-06-03 ENCOUNTER — Ambulatory Visit (INDEPENDENT_AMBULATORY_CARE_PROVIDER_SITE_OTHER): Payer: No Typology Code available for payment source | Admitting: Family

## 2020-06-03 ENCOUNTER — Encounter: Payer: Self-pay | Admitting: Family

## 2020-06-03 ENCOUNTER — Other Ambulatory Visit (HOSPITAL_COMMUNITY): Payer: Self-pay

## 2020-06-03 ENCOUNTER — Other Ambulatory Visit: Payer: Self-pay

## 2020-06-03 VITALS — BP 121/61 | HR 60 | Temp 98.6°F | Resp 16 | Ht 64.5 in | Wt 166.0 lb

## 2020-06-03 DIAGNOSIS — Z0000 Encounter for general adult medical examination without abnormal findings: Secondary | ICD-10-CM | POA: Diagnosis not present

## 2020-06-03 DIAGNOSIS — R17 Unspecified jaundice: Secondary | ICD-10-CM | POA: Diagnosis not present

## 2020-06-03 DIAGNOSIS — Z1159 Encounter for screening for other viral diseases: Secondary | ICD-10-CM

## 2020-06-03 LAB — HEPATIC FUNCTION PANEL
ALT: 15 U/L (ref 0–35)
AST: 20 U/L (ref 0–37)
Albumin: 4.6 g/dL (ref 3.5–5.2)
Alkaline Phosphatase: 42 U/L (ref 39–117)
Bilirubin, Direct: 0.1 mg/dL (ref 0.0–0.3)
Total Bilirubin: 1 mg/dL (ref 0.2–1.2)
Total Protein: 6.8 g/dL (ref 6.0–8.3)

## 2020-06-03 MED ORDER — BETAMETHASONE VALERATE 0.1 % EX OINT
1.0000 | TOPICAL_OINTMENT | Freq: Two times a day (BID) | CUTANEOUS | 1 refills | Status: DC
  Filled 2020-06-03: qty 30, 15d supply, fill #0
  Filled 2021-01-13: qty 30, 15d supply, fill #1

## 2020-06-03 MED ORDER — SUMATRIPTAN SUCCINATE 50 MG PO TABS
50.0000 mg | ORAL_TABLET | ORAL | 5 refills | Status: AC
  Filled 2020-06-03: qty 10, 30d supply, fill #0

## 2020-06-03 NOTE — Assessment & Plan Note (Signed)
Discussed healthy diet, exercise.  She will obtain Pap with GYN. Immunizations reviewed and up to date.  Reviewed lab work that she brought in today. Note was made of elevated bilirubin. Will repeat LFT's today.

## 2020-06-03 NOTE — Progress Notes (Signed)
Subjective:   By signing my name below, I, Shehryar Baig, attest that this documentation has been prepared under the direction and in the presence of Sandford Craze NP. 06/03/2020      Patient ID: Janet Holmes, female    DOB: 05-27-1985, 35 y.o.   MRN: [id number]  No chief complaint on file.   HPI   Patient is in today for a comprehensive physical exam. She is interested in discussing her lab results. She complains of skin rashes in her lower extremities. She mentions having elevated stress in her daily and family life which has caused her anxiety. She notes it slightly affects her sleep. She is willing to make an appointment with a counselor to manage these issues if her symptoms worsen. She is requesting a refill for 50 mg Imitrex PO to manage her migraines. She denies having any burning, frequency, constipation, diarrhea, depression at this time. She has had no change in her surgical history this past year. She has had no changes in her family medical history this past year. She does not drink, use drugs, and stopped smoking since 2007.  She reports having cough and cold symptoms last week but tested negative for Covid-19.   Immunizations: She has 3 Development worker, international aid vaccines. She had a flu vaccine in October, 2021. She is UTD on tetanus vaccines. She is interested in a hepatitis screening at this time. Diet: She manages a healthy diet. Exercise: She participates in exercise regularly. Colonoscopy: Not completed. Dexa: Not completed. Pap Smear: Her last Pap Smear was in 2018. She had an IUD placed in that same year as well. Mammogram: Not completed. Dental: She is UTD on dental care. Vision: She is UTD on vision care.    Past Medical History:  Diagnosis Date  . Anemia   . Chicken pox   . Clomid pregnancy   . Condyloma acuminatum   . H/O varicella   . Migraines   . Polycystic ovaries   . Polyhydramnios in third trimester for IOL 09/27/2011  . Polyhydramnios, antepartum  complication   . Postpartum care following vaginal delivery (9/17, 2nd deg perineal lac) 09/28/2011    Past Surgical History:  Procedure Laterality Date  . CESAREAN SECTION N/A 03/12/2015   Procedure: CESAREAN SECTION;  Surgeon: Olivia Mackie, MD;  Location: [facility];  Service: Obstetrics;  Laterality: N/A;  . KNEE ARTHROSCOPY     R  . MOLE REMOVAL    . WISDOM TOOTH EXTRACTION      Family History  Problem Relation Age of Onset  . Cancer Paternal Grandmother        breast, skin  . Hypertension Paternal Grandmother   . Stroke Paternal Grandmother   . Stroke Paternal Grandfather   . Hypertension Paternal Grandfather   . Diabetes Paternal Grandfather   . Cancer Mother        skin (basal cell carcinoma)  . Varicose Veins Mother   . Cancer Maternal Grandmother        breast  . Hypertension Maternal Grandfather   . Stroke Maternal Grandfather   . COPD Father        former smoker  . Depression Father   . Other Sister        frontal lobe disorder (hx of 6 concussions)  . Anxiety disorder Brother        hx substance/alcohol abuse    Social History   Socioeconomic History  . Marital status: Married    Spouse name: JOEY  . Number  of children: 2  . Years of education: Not on file  . Highest education level: Not on file  Occupational History  . Occupation: NP  Tobacco Use  . Smoking status: Former Smoker    Quit date: 05/17/2005    Years since quitting: 15.0  . Smokeless tobacco: Never Used  Vaping Use  . Vaping Use: Never used  Substance and Sexual Activity  . Alcohol use: No  . Drug use: No  . Sexual activity: Yes    Partners: Male    Birth control/protection: None  Other Topics Concern  . 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 Adapalene-Benzoyl Peroxide 0.1-2.5 % gel APPLY TOPICALLY AT BEDTIME 45 g 1  . betamethasone valerate ointment (VALISONE) 0.1 % Apply 1 application topically 2 (two) times daily. 30 g 1  . cholecalciferol (VITAMIN D) 1000 units tablet Take 1,000 Units by mouth daily.    . Continuous Blood Gluc Sensor (FREESTYLE LIBRE 14 DAY SENSOR) MISC Apply sensor q 14 days per instructions for glucose monitoring. 2 each 3  . ibuprofen (ADVIL,MOTRIN) 600 MG tablet Take 1 tablet (600 mg total) by mouth every 6 (six) hours as needed for mild pain. 30 tablet 0  . levonorgestrel (MIRENA) 20 MCG/24HR IUD 1 each by Intrauterine route once.    . meloxicam (MOBIC) 15 MG tablet Take 1 tablet (15 mg total) by mouth daily. 30 tablet 0  . meloxicam (MOBIC) 15 MG tablet TAKE 1 TABLET (15 MG TOTAL) BY MOUTH DAILY. 90 tablet 3  . naproxen (NAPROSYN) 250 MG tablet Take by mouth 2 (two) times daily with a meal.    . SUMAtriptan (IMITREX) 50 MG tablet One tab by mouth at start of migraine, may repeat in 2 hours if symptoms not improved 10 tablet 5   No facility-administered medications prior to visit.    Allergies  Allergen Reactions  . Polytrim [Polymyxin B-Trimethoprim] Other (See Comments)    Eye swelling    Review of Systems  Gastrointestinal: Negative for constipation and diarrhea.  Genitourinary: Negative for dysuria and frequency.  Skin: Positive for rash (Lower extremity).  Psychiatric/Behavioral: Negative for depression.       Objective:    Physical Exam Constitutional:      Appearance: Normal appearance.  HENT:     Head: Normocephalic and atraumatic.     Right Ear: Tympanic membrane  and external ear normal.     Left Ear: Tympanic membrane and external ear normal.  Eyes:     Extraocular Movements: Extraocular movements intact.     Pupils: Pupils are equal, round, and reactive to light.     Comments: No nystagmus   Cardiovascular:     Rate and Rhythm: Normal rate and regular rhythm.     Pulses: Normal pulses.     Heart sounds: Normal heart sounds. No murmur  heard. No gallop.   Pulmonary:     Effort: Pulmonary effort is normal. No respiratory distress.     Breath sounds: Normal breath sounds. No wheezing, rhonchi or rales.  Chest:  Breasts:     Right: Normal. No swelling or mass.     Left: Normal. No swelling or mass.      Comments: Bilateral breast implants Abdominal:     General: Bowel sounds are normal. There is no distension.     Palpations: Abdomen is soft. There is no mass.     Tenderness: There is no abdominal tenderness. There is no guarding or rebound.     Hernia: No hernia is present.  Musculoskeletal:     Comments: 5/5 strength in both upper and lower extremities  Lymphadenopathy:     Cervical: No cervical adenopathy.  Skin:    General: Skin is warm and dry.  Neurological:     Mental Status: She is alert and oriented to person, place, and time.     Comments: Normal patellar reflexes  Psychiatric:        Behavior: Behavior normal.     There were no vitals taken for this visit. Wt Readings from Last 3 Encounters:  05/06/20 158 lb (71.7 kg)  12/25/19 161 lb (73 kg)  08/07/19 166 lb (75.3 kg)    Diabetic Foot Exam - Simple   No data filed    Lab Results  Component Value Date   WBC 4.0 05/30/2018   HGB 13.4 05/30/2018   HCT 39.4 05/30/2018   PLT 173.0 05/30/2018   GLUCOSE 77 05/30/2018   CHOL 140 06/03/2019   TRIG 51.0 06/03/2019   HDL 52.10 06/03/2019   LDLCALC 78 06/03/2019   ALT 19 05/30/2018   AST 18 05/30/2018   NA 140 05/30/2018   K 4.7 05/30/2018   CL 104 05/30/2018   CREATININE 0.77 05/30/2018   BUN 16 05/30/2018    CO2 29 05/30/2018   TSH 2.40 05/30/2018   HGBA1C 5.4 06/03/2019    Lab Results  Component Value Date   TSH 2.40 05/30/2018   Lab Results  Component Value Date   WBC 4.0 05/30/2018   HGB 13.4 05/30/2018   HCT 39.4 05/30/2018   MCV 95.5 05/30/2018   PLT 173.0 05/30/2018   Lab Results  Component Value Date   NA 140 05/30/2018   K 4.7 05/30/2018   CO2 29 05/30/2018   GLUCOSE 77 05/30/2018   BUN 16 05/30/2018   CREATININE 0.77 05/30/2018   BILITOT 1.0 05/30/2018   ALKPHOS 29 (L) 05/30/2018   AST 18 05/30/2018   ALT 19 05/30/2018   PROT 6.7 05/30/2018   ALBUMIN 4.6 05/30/2018   CALCIUM 9.2 05/30/2018   GFR 85.68 05/30/2018   Lab Results  Component Value Date   CHOL 140 06/03/2019   Lab Results  Component Value Date   HDL 52.10 06/03/2019   Lab Results  Component Value Date   LDLCALC 78 06/03/2019   Lab Results  Component Value Date   TRIG 51.0 06/03/2019   Lab Results  Component Value Date   CHOLHDL 3 06/03/2019   Lab Results  Component Value Date   HGBA1C 5.4 06/03/2019       Assessment & Plan:   Problem List Items Addressed This Visit   None      No orders of the defined types were placed in this encounter.   I, Sandford CrazeMelissa O'Sullivan NP, personally preformed the services described in this documentation.  All  medical record entries made by the scribe were at my direction and in my presence.  I have reviewed the chart and discharge instructions (if applicable) and agree that the record reflects my personal performance and is accurate and complete. 06/03/2020   I,Shehryar Baig,acting as a Neurosurgeon for Lemont Fillers, NP.,have documented all relevant documentation on the behalf of Lemont Fillers, NP,as directed by  Lemont Fillers, NP while in the presence of Lemont Fillers, NP.   Shehryar [employer]

## 2020-06-03 NOTE — Patient Instructions (Signed)
Please complete lab work prior to leaving.  Continue healthy diet and regular exercise.  

## 2020-06-04 LAB — HEPATITIS C ANTIBODY
Hepatitis C Ab: NONREACTIVE
SIGNAL TO CUT-OFF: 0 (ref <1.00)

## 2020-06-13 ENCOUNTER — Other Ambulatory Visit: Payer: Self-pay | Admitting: Family

## 2020-06-13 MED ORDER — PREDNISONE 20 MG PO TABS: ORAL_TABLET | ORAL | 0 refills | Status: DC

## 2020-06-20 IMAGING — DX DG PELVIS 1-2V
1 series · 1 of 1 positions shown · non-contrast
Comparison: None.

CLINICAL DATA: Pelvic pain, initial encounter

EXAM:
PELVIS - 1- VIEW

[pelvis ap]
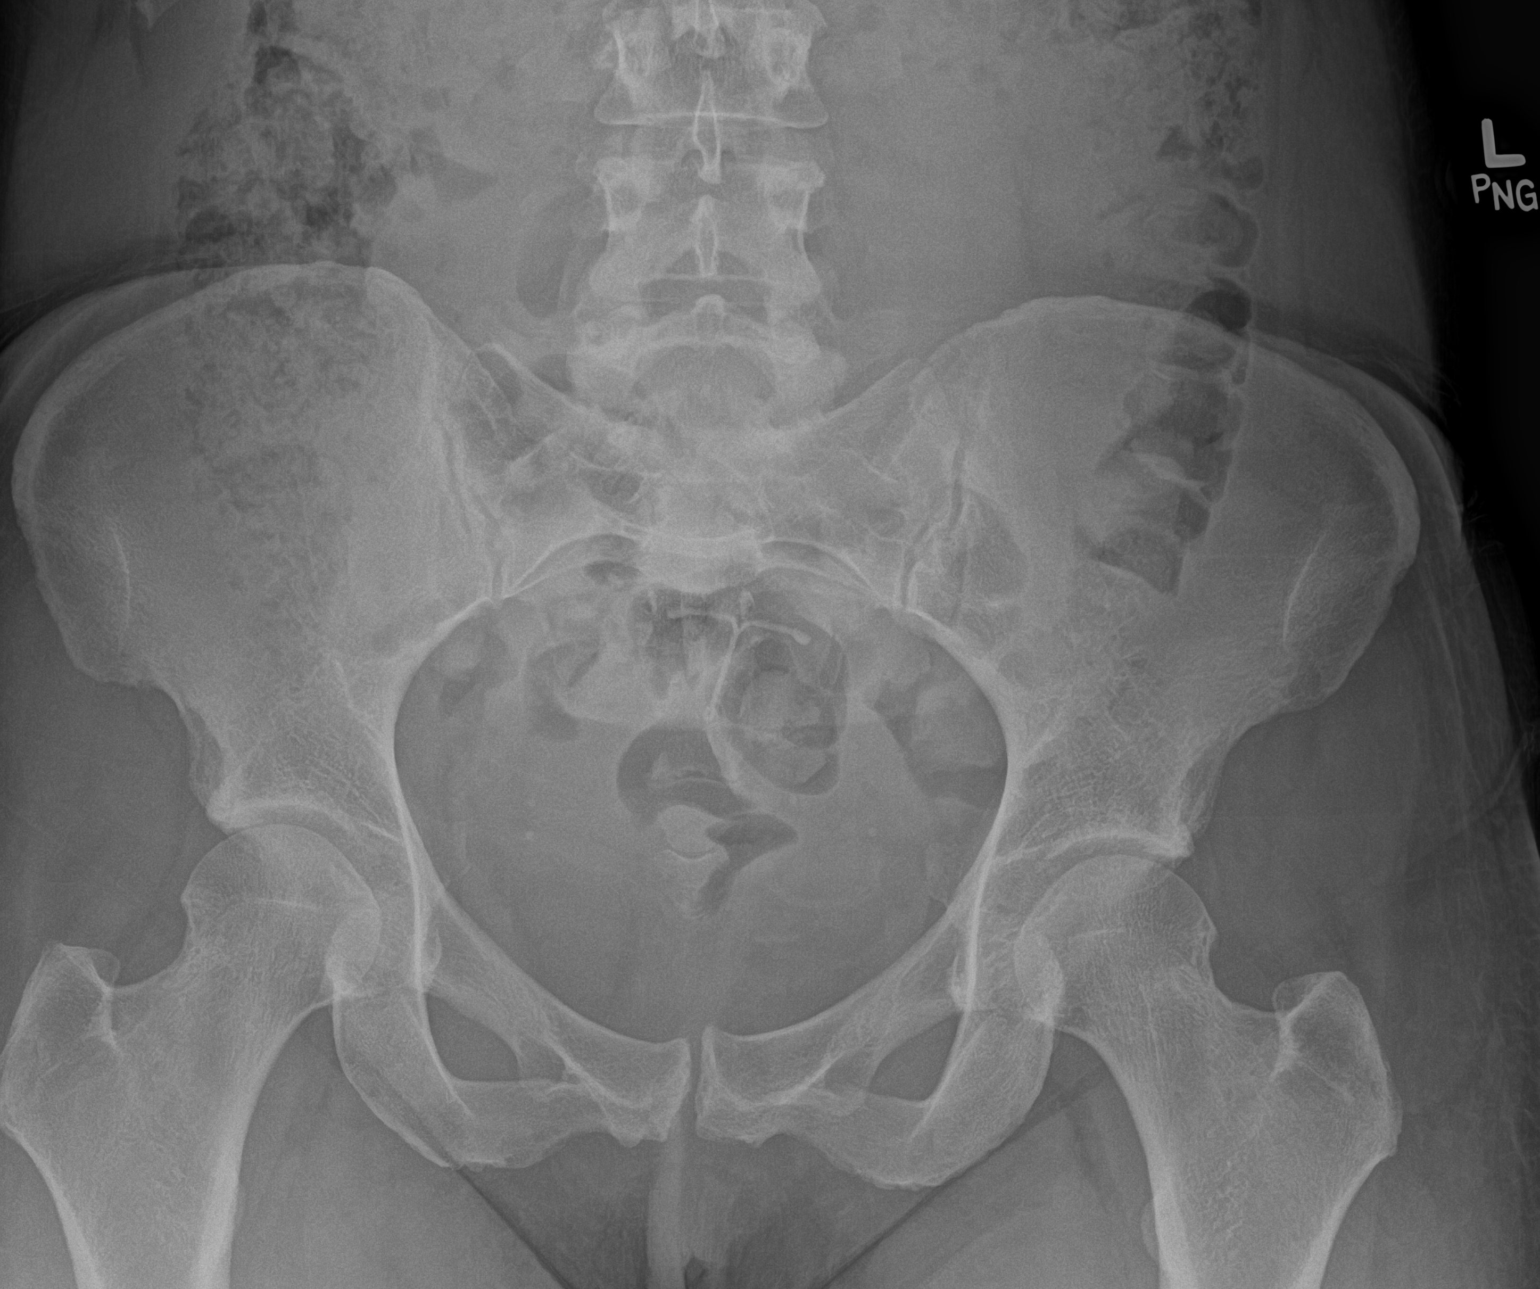

[1 of 1 positions shown; findings below may reference images not displayed]

FINDINGS: Pelvic ring is intact. IUD is noted in the midline of the pelvis. No
acute fracture or dislocation is noted. No soft tissue abnormality
is seen. Mild degenerative changes of the hip joints are noted.
IMPRESSION: No acute abnormality noted.

## 2020-07-09 DIAGNOSIS — Z719 Counseling, unspecified: Secondary | ICD-10-CM

## 2020-07-21 ENCOUNTER — Other Ambulatory Visit (HOSPITAL_BASED_OUTPATIENT_CLINIC_OR_DEPARTMENT_OTHER): Payer: Self-pay

## 2020-07-21 MED ORDER — CARESTART COVID-19 HOME TEST VI KIT
PACK | 0 refills | Status: DC
  Filled 2020-07-21: qty 4, 4d supply, fill #0

## 2020-09-16 ENCOUNTER — Encounter: Payer: Self-pay | Admitting: Plastic Surgery

## 2020-09-25 ENCOUNTER — Encounter: Payer: Self-pay | Admitting: Family

## 2020-10-23 ENCOUNTER — Other Ambulatory Visit: Payer: Self-pay | Admitting: Family

## 2020-10-23 MED ORDER — MUPIROCIN CALCIUM 2 % NA OINT: 1.0000 "application " | TOPICAL_OINTMENT | Freq: Two times a day (BID) | NASAL | 0 refills | Status: DC

## 2020-10-23 MED ORDER — FREESTYLE LIBRE 14 DAY SENSOR MISC: 2 refills | Status: DC

## 2020-11-12 ENCOUNTER — Other Ambulatory Visit: Payer: Self-pay

## 2020-11-12 ENCOUNTER — Encounter: Payer: Self-pay | Admitting: Plastic Surgery

## 2020-11-12 ENCOUNTER — Other Ambulatory Visit (HOSPITAL_BASED_OUTPATIENT_CLINIC_OR_DEPARTMENT_OTHER): Payer: Self-pay

## 2020-11-12 ENCOUNTER — Ambulatory Visit (INDEPENDENT_AMBULATORY_CARE_PROVIDER_SITE_OTHER): Payer: Self-pay | Admitting: Plastic Surgery

## 2020-11-12 VITALS — BP 117/68 | HR 60 | Ht 66.0 in | Wt 163.4 lb

## 2020-11-12 DIAGNOSIS — Z411 Encounter for cosmetic surgery: Secondary | ICD-10-CM

## 2020-11-12 MED ORDER — HYDROCODONE-ACETAMINOPHEN 5-325 MG PO TABS
1.0000 | ORAL_TABLET | ORAL | 0 refills | Status: DC | PRN
  Filled 2020-11-12: qty 30, 5d supply, fill #0

## 2020-11-12 MED ORDER — ONDANSETRON HCL 4 MG PO TABS
4.0000 mg | ORAL_TABLET | Freq: Three times a day (TID) | ORAL | 0 refills | Status: DC | PRN
  Filled 2020-11-12: qty 5, 2d supply, fill #0

## 2020-11-12 MED ORDER — CELECOXIB 200 MG PO CAPS
200.0000 mg | ORAL_CAPSULE | Freq: Two times a day (BID) | ORAL | 2 refills | Status: DC
  Filled 2020-11-12: qty 60, 30d supply, fill #0
  Filled 2021-01-13: qty 60, 30d supply, fill #1
  Filled 2021-06-24: qty 60, 30d supply, fill #2

## 2020-11-12 NOTE — Progress Notes (Signed)
Referring Provider Debbrah Alar, NP [address] [place],   [id number]   CC:  Chief Complaint  Patient presents with   Pre-op Exam      Janet Holmes is an 35 y.o. female.  HPI: Patient presents for preop visit for upcoming abdominoplasty.  She has a few questions about the upcoming procedure but we have talked about this quite a bit and she has a pretty clear understanding of what she wants and reasonable expectations.  Allergies  Allergen Reactions   Polytrim [Polymyxin B-Trimethoprim] Other (See Comments)    Eye swelling    Outpatient Encounter Medications as of 11/12/2020  Medication Sig   betamethasone valerate ointment (VALISONE) 0.1 % Apply 1 application topically 2 (two) times daily.   cholecalciferol (VITAMIN D) 1000 units tablet Take 1,000 Units by mouth daily.   COVID-19 At Home Antigen Test [facility] COVID-19 HOME TEST) KIT Use as directed per package instructions   levonorgestrel (MIRENA) 20 MCG/24HR IUD 1 each by Intrauterine route once.   meloxicam (MOBIC) 15 MG tablet Take 1 tablet (15 mg total) by mouth daily.   mupirocin nasal ointment (BACTROBAN) 2 % Place 1 application into the nose 2 (two) times daily. Use one-half of tube in each nostril twice daily for five (5) days. After application, press sides of nose together and gently massage.   naproxen (NAPROSYN) 250 MG tablet Take by mouth 2 (two) times daily with a meal.   SUMAtriptan (IMITREX) 50 MG tablet Take one tablet by mouth at start of migraine, may repeat in 2 hours if symptoms not improved   Continuous Blood Gluc Sensor (FREESTYLE LIBRE 14 DAY SENSOR) MISC Apply 1 sensor q 14 days for blood sugar monitoring (Patient not taking: Reported on 11/12/2020)   [DISCONTINUED] predniSONE (DELTASONE) 20 MG tablet Take 2 tablets by mouth daily for 5 days, then 1 tablet by mouth daily for 5 days, then 0.5 tablet by mouth daily for 4 days.   No facility-administered encounter medications  on file as of 11/12/2020.     Past Medical History:  Diagnosis Date   Anemia    Chicken pox    Clomid pregnancy    Condyloma acuminatum    H/O varicella    Migraines    Polycystic ovaries    Polyhydramnios in third trimester for IOL 09/27/2011   Polyhydramnios, antepartum complication    Postpartum care following vaginal delivery (9/17, 2nd deg perineal lac) 09/28/2011    Past Surgical History:  Procedure Laterality Date   CESAREAN SECTION N/A 03/12/2015   Procedure: CESAREAN SECTION;  Surgeon: Brien Few, MD;  Location: [facility];  Service: Obstetrics;  Laterality: N/A;   KNEE ARTHROSCOPY     R   MOLE REMOVAL     WISDOM TOOTH EXTRACTION      Family History  Problem Relation Age of Onset   Cancer Paternal Grandmother        breast, skin   Hypertension Paternal Grandmother    Stroke Paternal Grandmother    Stroke Paternal Grandfather    Hypertension Paternal Grandfather    Diabetes Paternal Grandfather    Cancer Mother        skin (basal cell carcinoma)   Varicose Veins Mother    Cancer Maternal Grandmother        breast   Hypertension Maternal Grandfather    Stroke Maternal Grandfather    COPD Father        former smoker   Depression Father  Other Sister        frontal lobe disorder (hx of 6 concussions)   Anxiety disorder Brother        hx substance/alcohol abuse    Social History   Social History Narrative   Designer, jewellery with ID at [employer]   One son age 15   One daughter age 27   Married   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, used to ride [employer] competitively, reading,    One dog and one cat     Review of Systems General: Denies fevers, chills, weight loss CV: Denies chest pain, shortness of breath, palpitations  Physical Exam Vitals with BMI 11/12/2020 06/03/2020 05/06/2020  Height _0  5' 4.5" _1   Weight 163 lbs 6 oz 166 lbs 158 lbs  BMI 26.39 48.27 07.86  Systolic 754 492 010  Diastolic 68 61 71  Pulse 60 60 68     General:  No acute distress,  Alert and oriented, Non-Toxic, Normal speech and affect Cardiovascular regular rhythm Pulmonary: Unlabored Abdomen: Unchanged  Assessment/Plan Patient presents for preoperative visit for upcoming abdominoplasty.  We discussed the risks and benefits of the procedure and reviewed her consent form which she signed today.  I will plan to give her Norco, Celebrex, and Zofran postoperatively.  We reviewed the location and orientation of the scar.  We reviewed the details of the muscle plication.  She is fully understanding and interested in moving forward.  Cindra Presume 11/12/2020, 5:07 PM

## 2020-11-24 ENCOUNTER — Encounter: Payer: Self-pay | Admitting: Plastic Surgery

## 2020-11-26 ENCOUNTER — Other Ambulatory Visit: Payer: Self-pay

## 2020-11-26 ENCOUNTER — Ambulatory Visit (INDEPENDENT_AMBULATORY_CARE_PROVIDER_SITE_OTHER): Payer: No Typology Code available for payment source | Admitting: Plastic Surgery

## 2020-11-26 DIAGNOSIS — Z411 Encounter for cosmetic surgery: Secondary | ICD-10-CM

## 2020-11-26 NOTE — Progress Notes (Signed)
Patient presents 1 week postop from abdominoplasty.  She feels like things are going well.  She did have some uvular necrosis likely due to pressure from the ET tube and that has been her main complaint.  That is getting better however with symptom management with lidocaine sprays and lozenges.  On exam her contour looks great.  Incisions are intact and healing appropriately.  No subcutaneous fluid.  Drain is put out less than 30 cc a day and was removed today.  We will plan to continue compressive garments and avoid strenuous activity and see her again in around 3 weeks.  All of her questions were answered.

## 2020-12-02 ENCOUNTER — Encounter: Payer: No Typology Code available for payment source | Admitting: Plastic Surgery

## 2020-12-16 ENCOUNTER — Ambulatory Visit (INDEPENDENT_AMBULATORY_CARE_PROVIDER_SITE_OTHER): Payer: No Typology Code available for payment source | Admitting: Plastic Surgery

## 2020-12-16 ENCOUNTER — Other Ambulatory Visit: Payer: Self-pay

## 2020-12-16 DIAGNOSIS — Z411 Encounter for cosmetic surgery: Secondary | ICD-10-CM

## 2020-12-16 DIAGNOSIS — Z719 Counseling, unspecified: Secondary | ICD-10-CM

## 2020-12-16 NOTE — Progress Notes (Signed)
Patient presents about 3-1/2 weeks out from her abdominoplasty.  She overall feels like things are going well.  She reports minimal pain and just some residual tightness at this point.  On exam her overall contour looks quite good.  There is some pigmentation to her scars which is expected for this time point in the healing process.  She looks to have a nice maintenance of her plication.  Some residual sutures were removed today.  Overall looks like she is coming along well.  I encouraged her to avoid strenuous activity for another month.  We will plan to see her in 4 to 6 weeks time.  All of her questions were answered.

## 2020-12-22 ENCOUNTER — Other Ambulatory Visit (HOSPITAL_BASED_OUTPATIENT_CLINIC_OR_DEPARTMENT_OTHER): Payer: Self-pay

## 2020-12-22 ENCOUNTER — Other Ambulatory Visit: Payer: Self-pay | Admitting: Pharmacist

## 2020-12-22 MED ORDER — MOUNJARO 2.5 MG/0.5ML ~~LOC~~ SOAJ
2.5000 mg | SUBCUTANEOUS | 0 refills | Status: DC
  Filled 2020-12-22: qty 2, 28d supply, fill #0

## 2020-12-23 ENCOUNTER — Other Ambulatory Visit (HOSPITAL_BASED_OUTPATIENT_CLINIC_OR_DEPARTMENT_OTHER): Payer: Self-pay

## 2020-12-23 MED ORDER — CARESTART COVID-19 HOME TEST VI KIT
PACK | 0 refills | Status: DC
  Filled 2020-12-23: qty 4, 4d supply, fill #0

## 2021-01-08 ENCOUNTER — Other Ambulatory Visit (HOSPITAL_BASED_OUTPATIENT_CLINIC_OR_DEPARTMENT_OTHER): Payer: Self-pay

## 2021-01-08 MED ORDER — MOUNJARO 2.5 MG/0.5ML ~~LOC~~ SOAJ
SUBCUTANEOUS | 2 refills | Status: DC
  Filled 2021-01-08 – 2021-01-13 (×3): qty 2, 28d supply, fill #0
  Filled 2021-02-12: qty 2, 28d supply, fill #1
  Filled 2021-03-23: qty 2, 28d supply, fill #2

## 2021-01-13 ENCOUNTER — Other Ambulatory Visit: Payer: Self-pay

## 2021-01-13 ENCOUNTER — Other Ambulatory Visit (HOSPITAL_BASED_OUTPATIENT_CLINIC_OR_DEPARTMENT_OTHER): Payer: Self-pay

## 2021-01-13 ENCOUNTER — Ambulatory Visit (INDEPENDENT_AMBULATORY_CARE_PROVIDER_SITE_OTHER): Payer: No Typology Code available for payment source | Admitting: Plastic Surgery

## 2021-01-13 DIAGNOSIS — Z411 Encounter for cosmetic surgery: Secondary | ICD-10-CM

## 2021-01-13 NOTE — Progress Notes (Signed)
Patient presents just shy of 2 months out from her abdominoplasty.  She is overall happy.  She did notice a spitting staple and came in today for me to remove that.  On exam her contour remains good.  The scar has begun to soften.  There was a spitting in sorb staple which was removed without issue.  The umbilicus is softening and I believe once the scar pigment fades she will have a good result.  The integrity of the plication seems to be holding and I do not feel any obvious gaps or see any signs of weakening in that regard.  She is going to begin increasing her activity slowly over the next few weeks and I will plan to check her again in 2 months to see how she is doing.  All of her questions were answered.

## 2021-01-20 ENCOUNTER — Ambulatory Visit: Payer: No Typology Code available for payment source | Admitting: Plastic Surgery

## 2021-02-12 ENCOUNTER — Other Ambulatory Visit (HOSPITAL_BASED_OUTPATIENT_CLINIC_OR_DEPARTMENT_OTHER): Payer: Self-pay

## 2021-02-15 ENCOUNTER — Encounter: Payer: Self-pay | Admitting: Family

## 2021-02-24 ENCOUNTER — Other Ambulatory Visit: Payer: Self-pay

## 2021-02-24 ENCOUNTER — Ambulatory Visit (INDEPENDENT_AMBULATORY_CARE_PROVIDER_SITE_OTHER): Payer: Self-pay | Admitting: Plastic Surgery

## 2021-02-24 DIAGNOSIS — Z411 Encounter for cosmetic surgery: Secondary | ICD-10-CM

## 2021-02-24 NOTE — Progress Notes (Signed)
Patient presents 3 months postop from abdominoplasty.  She is overall very happy.  She is gone back to her activities and feels like that is going well.  Her only concern is regarding her umbilicus.  She feels that it protrudes depending on the contraction of her musculature.  On exam she has a great contour.  Her scar is healing nicely and is well hidden.  She does have a bit of a prominence to her umbilicus stalk and skin depending on the contraction of her abdominal muscles.  I did discuss with her that we may consider a revision of this a few months down the line.  I would be able to remove some of the umbilicus skin and tack it down to the fascia and I believe that would correct the issue.  She believes she will be interested in that and after a few more months will let me know how she wants to proceed.  Otherwise she is doing great and all of her other questions were answered.

## 2021-03-23 ENCOUNTER — Other Ambulatory Visit (HOSPITAL_BASED_OUTPATIENT_CLINIC_OR_DEPARTMENT_OTHER): Payer: Self-pay

## 2021-04-01 ENCOUNTER — Other Ambulatory Visit (HOSPITAL_COMMUNITY): Payer: Self-pay

## 2021-05-19 ENCOUNTER — Ambulatory Visit (INDEPENDENT_AMBULATORY_CARE_PROVIDER_SITE_OTHER): Payer: No Typology Code available for payment source | Admitting: Plastic Surgery

## 2021-05-19 DIAGNOSIS — Z411 Encounter for cosmetic surgery: Secondary | ICD-10-CM

## 2021-05-19 NOTE — Progress Notes (Signed)
Patient presents about 6 months postop from abdominoplasty.  She overall feels happy with the result.  We have been watching her umbilicus and considering a revision in that area.  She feels that this is improved a bit since her last visit with me.  On exam there is a small skin fold along the scar line in the superior and right side of the umbilicus.  It is not sticking out anywhere near as much as it was at her last visit and in general looks to have a reasonable appearance.  The inferior scar is coming along fine.  She points out some potential separation in the plication but overall I think this feels pretty solid 6 months out in certainly does not appear to be causing a visual appearance of diastases recurrence.  She is interested in pursuing a minor revision of the umbilicus scar which I think could be accomplished just by excising a few millimeters of skin superiorly and to the right.  I do think that this would improve the appearance of the skin fold in that area.  We will plan to do this sometime over the summer.  All of her questions were answered. ?

## 2021-06-04 ENCOUNTER — Encounter: Payer: Self-pay | Admitting: Family

## 2021-06-04 ENCOUNTER — Ambulatory Visit (INDEPENDENT_AMBULATORY_CARE_PROVIDER_SITE_OTHER): Payer: No Typology Code available for payment source | Admitting: Family

## 2021-06-04 VITALS — BP 119/57 | HR 54 | Temp 98.4°F | Resp 16 | Wt 155.8 lb

## 2021-06-04 DIAGNOSIS — E282 Polycystic ovarian syndrome: Secondary | ICD-10-CM

## 2021-06-04 DIAGNOSIS — R17 Unspecified jaundice: Secondary | ICD-10-CM

## 2021-06-04 DIAGNOSIS — E785 Hyperlipidemia, unspecified: Secondary | ICD-10-CM

## 2021-06-04 DIAGNOSIS — Z0000 Encounter for general adult medical examination without abnormal findings: Secondary | ICD-10-CM

## 2021-06-04 LAB — COMPREHENSIVE METABOLIC PANEL
ALT: 16 U/L (ref 0–35)
AST: 18 U/L (ref 0–37)
Albumin: 4.6 g/dL (ref 3.5–5.2)
Alkaline Phosphatase: 35 U/L — ABNORMAL LOW (ref 39–117)
BUN: 20 mg/dL (ref 6–23)
CO2: 30 mEq/L (ref 19–32)
Calcium: 9.6 mg/dL (ref 8.4–10.5)
Chloride: 102 mEq/L (ref 96–112)
Creatinine, Ser: 0.94 mg/dL (ref 0.40–1.20)
GFR: 77.65 mL/min (ref >60.00)
Glucose, Bld: 80 mg/dL (ref 70–99)
Potassium: 4.3 mEq/L (ref 3.5–5.1)
Sodium: 138 mEq/L (ref 135–145)
Total Bilirubin: 1.1 mg/dL (ref 0.2–1.2)
Total Protein: 6.8 g/dL (ref 6.0–8.3)

## 2021-06-04 LAB — HEMOGLOBIN A1C: Hgb A1c MFr Bld: 5.3 % (ref 4.6–6.5)

## 2021-06-04 LAB — LIPID PANEL
Cholesterol: 151 mg/dL (ref 0–200)
HDL: 55.1 mg/dL (ref >39.00)
LDL Cholesterol: 86 mg/dL (ref 0–99)
NonHDL: 96.3
Total CHOL/HDL Ratio: 3
Triglycerides: 53 mg/dL (ref 0.0–149.0)
VLDL: 10.6 mg/dL (ref 0.0–40.0)

## 2021-06-04 NOTE — Assessment & Plan Note (Addendum)
Wt Readings from Last 3 Encounters:  06/04/21 155 lb 12.8 oz (70.7 kg)  11/12/20 163 lb 6.4 oz (74.1 kg)  06/03/20 166 lb (75.3 kg)   Continues healthy diet, exercise. Recommended Bivalent booster. She will update pap with GYN.

## 2021-06-04 NOTE — Progress Notes (Addendum)
Subjective:   By signing my name below, I, Janet Holmes, attest that this documentation has been prepared under the direction and in the presence of Janet Holmes, 06/04/2021   Patient ID: Janet Holmes, female    DOB: 26-Mar-1985, 36 y.o.   MRN: [id number]  Chief Complaint  Patient presents with   Annual Exam         HPI Patient is in today for a comprehensive physical exam.   Allergies - She reports that her allergies are worsening.  Blood Test - She is requesting to get her lipid levels checked.   Headaches - She reports that her headaches are resolved. She reports that she rarely takes 50 Mg of Imitrex. She does take a higher dose of Magnesium nightly and reports that medications have improved her symptoms.  She denies having any fever, ear pain, new muscle pain, joint pain, new moles, congestion, sinus pain, sore throat, palpations, wheezing, n/v/d, constipation, blood in stool, dysuria, frequency, hematuria, headaches, depresssion or anxiety at this time.   Social History: She reports receiving a diastasis recti surgery and a tummy tuck on 12/22. She reports that the surgery has improved her daily living.  Pap Smear: Last completed on 07/11/2016.  Immunizations: She reports that she has not received the Bivalent Covid 19 vaccine. She is UTD on tetanus and influenza vaccine.  Diet: She is eating a healthy diet  Exercise: She is exercising regularly. She has  a Child psychotherapist and  is working out 5 - 6 days a week.  Dental: She is UTD on dental exams.  Vision: She is UTD on vision exams.     Health Maintenance Due  Topic Date Due   PAP SMEAR-Modifier  07/12/2019   COVID-19 Vaccine (4 - Booster for Pfizer series) 02/21/2020    Past Medical History:  Diagnosis Date   Anemia    Chicken pox    Clomid pregnancy    Condyloma acuminatum    H/O varicella    Migraines    Polycystic ovaries    Polyhydramnios in third trimester for IOL 09/27/2011    Polyhydramnios, antepartum complication    Postpartum care following vaginal delivery (9/17, 2nd deg perineal lac) 09/28/2011    Past Surgical History:  Procedure Laterality Date   CESAREAN SECTION N/A 03/12/2015   Procedure: CESAREAN SECTION;  Surgeon: Brien Few, MD;  Location: [facility];  Service: Obstetrics;  Laterality: N/A;   KNEE ARTHROSCOPY     R   MOLE REMOVAL     WISDOM TOOTH EXTRACTION      Family History  Problem Relation Age of Onset   Cancer Paternal Grandmother        breast, skin   Hypertension Paternal Grandmother    Stroke Paternal Grandmother    Stroke Paternal Grandfather    Hypertension Paternal Grandfather    Diabetes Paternal Grandfather    Cancer Mother        skin (basal cell carcinoma)   Varicose Veins Mother    Cancer Maternal Grandmother        breast   Hypertension Maternal Grandfather    Stroke Maternal Grandfather    COPD Father        former smoker   Depression Father    Other Sister        frontal lobe disorder (hx of 6 concussions)   Anxiety disorder Brother        hx substance/alcohol abuse    Social History   Socioeconomic History  Marital status: Married    Spouse name: Janet Holmes   Number of children: 2   Years of education: Not on file   Highest education level: Not on file  Occupational History   Occupation: Holmes  Tobacco Use   Smoking status: Former    Types: Cigarettes    Quit date: 05/17/2005    Years since quitting: 16.0   Smokeless tobacco: Never  Vaping Use   Vaping Use: Never used  Substance and Sexual Activity   Alcohol use: No   Drug use: No   Sexual activity: Yes    Partners: Male    Birth control/protection: None, I.U.D.  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 30 g 1   celecoxib (CELEBREX) 200 MG capsule Take 1 capsule (200 mg total) by mouth 2 (two) times daily. 60 capsule 2   cholecalciferol (VITAMIN D) 1000 units tablet Take 1,000 Units by mouth daily.     levonorgestrel (MIRENA) 20 MCG/24HR IUD 1 each by Intrauterine route once.     naproxen (NAPROSYN) 250 MG tablet Take by mouth 2 (two) times daily with a meal.     SUMAtriptan (IMITREX) 50 MG tablet Take one tablet by mouth at start of migraine, may repeat in 2 hours if symptoms not improved 10 tablet 5   tirzepatide (MOUNJARO) 2.5 MG/0.5ML Pen Inject 2.5 mg under the skin once weekly 2 mL 2   COVID-19 At Home Antigen Test (CARESTART COVID-19 HOME TEST) KIT Use as directed per package instructions 4 each 0   COVID-19 At Home Antigen Test (CARESTART COVID-19 HOME TEST) KIT Use as directed per package instructions 4 each 0   meloxicam (MOBIC) 15 MG tablet Take 1 tablet (15 mg total) by mouth daily. 30 tablet 0   No facility-administered medications prior to visit.    Allergies  Allergen Reactions   Polytrim [Polymyxin B-Trimethoprim] Other (See Comments)    Eye swelling    Review of Systems  Constitutional:  Negative for fever.  HENT:  Negative for congestion, sinus pain and sore throat.        (+) Papilledema which is being followed by her eye doctor and is being attributed to her IUD which she plans to have removed soon.   Respiratory:  Negative for wheezing.   Cardiovascular:  Negative for palpitations.  Gastrointestinal:  Negative for blood in stool,  constipation, diarrhea, nausea and vomiting.  Genitourinary:  Negative for dysuria, frequency and hematuria.  Musculoskeletal:  Negative for back pain, joint pain and myalgias.  Skin:        (-) [place]  Neurological:  Negative for headaches.  Endo/Heme/Allergies:  Positive for environmental allergies.  Psychiatric/Behavioral:  Negative for depression. The patient is not nervous/anxious.  Objective:    Physical Exam Constitutional:      General: She is not in acute distress.    Appearance: Normal appearance. She is not ill-appearing.  HENT:     Head: Normocephalic and atraumatic.     Right Ear: Tympanic membrane, ear canal and external ear normal.     Left Ear: Tympanic membrane, ear canal and external ear normal.     Mouth/Throat:     Pharynx: No oropharyngeal exudate.  Eyes:     Extraocular Movements: Extraocular movements intact.     Pupils: Pupils are equal, round, and reactive to light.  Neck:     Thyroid: No thyromegaly.  Cardiovascular:     Rate and Rhythm: Normal rate and regular rhythm.     Heart sounds: Normal heart sounds. No murmur heard.   No gallop.  Pulmonary:     Effort: Pulmonary effort is normal. No respiratory distress.     Breath sounds: Normal breath sounds. No wheezing or rales.  Abdominal:     General: Bowel sounds are normal. There is no distension.     Palpations: Abdomen is soft.     Tenderness: There is no abdominal tenderness. There is no guarding.  Musculoskeletal:     Comments: 5/5 strength in both upper and lower extremities   Lymphadenopathy:     Cervical: No cervical adenopathy.  Skin:    General: Skin is warm and dry.  Neurological:     Mental Status: She is alert and oriented to person, place, and time.     Deep Tendon Reflexes:     Reflex Scores:      Patellar reflexes are 2+ on the right side and 2+ on the left side. Psychiatric:        Mood and Affect: Mood normal.        Behavior: Behavior normal.        Judgment:  Judgment normal.    BP (!) 119/57 (BP Location: Right Arm, Patient Position: Sitting, Cuff Size: Small)   Pulse (!) 54   Temp 98.4 F (36.9 C) (Oral)   Resp 16   Wt 155 lb 12.8 oz (70.7 kg)   SpO2 (!) 5%   BMI 25.15 kg/m  Wt Readings from Last 3 Encounters:  06/04/21 155 lb 12.8 oz (70.7 kg)  11/12/20 163 lb 6.4 oz (74.1 kg)  06/03/20 166 lb (75.3 kg)       Assessment & Plan:   Problem List Items Addressed This Visit       Unprioritized   Preventative health care - Primary    Wt Readings from Last 3 Encounters:  06/04/21 155 lb 12.8 oz (70.7 kg)  11/12/20 163 lb 6.4 oz (74.1 kg)  06/03/20 166 lb (75.3 kg)  Continues healthy diet, exercise. Recommended Bivalent booster. She will update pap with GYN.       Relevant Orders   Hemoglobin A1c   Other Visit Diagnoses     PCOS (polycystic ovarian syndrome)       Relevant Orders   Comp Met (CMET)   Elevated bilirubin       Relevant Orders   Comp Met (CMET)   Hyperlipidemia, unspecified hyperlipidemia type       Relevant Orders   Lipid panel       No orders of the defined types were placed in this encounter.   I, Nance Pear, Holmes, personally preformed the services described in this documentation.  All medical record entries made by the scribe were  at my direction and in my presence.  I have reviewed the chart and discharge instructions (if applicable) and agree that the record reflects my personal performance and is accurate and complete. 06/04/2021   I,Amber Collins,acting as a scribe for Nance Pear, Holmes.,have documented all relevant documentation on the behalf of Nance Pear, Holmes,as directed by  Nance Pear, Holmes while in the presence of Nance Pear, Holmes.    Nance Pear, Holmes

## 2021-06-24 ENCOUNTER — Other Ambulatory Visit (HOSPITAL_BASED_OUTPATIENT_CLINIC_OR_DEPARTMENT_OTHER): Payer: Self-pay

## 2021-06-24 ENCOUNTER — Ambulatory Visit (INDEPENDENT_AMBULATORY_CARE_PROVIDER_SITE_OTHER): Payer: Self-pay | Admitting: Plastic Surgery

## 2021-06-24 ENCOUNTER — Encounter: Payer: Self-pay | Admitting: Family

## 2021-06-24 VITALS — BP 111/56 | HR 65 | Ht 65.0 in | Wt 160.6 lb

## 2021-06-24 DIAGNOSIS — K429 Umbilical hernia without obstruction or gangrene: Secondary | ICD-10-CM

## 2021-06-24 DIAGNOSIS — Z411 Encounter for cosmetic surgery: Secondary | ICD-10-CM

## 2021-06-24 NOTE — Progress Notes (Signed)
Patient presents for revision of her umbilicus scar.  She has had some prominence to the umbilicus since abdominoplasty that we have been watching.  On examining her today it does feel like she may have a small umbilical hernia defect that may be contributing to this contour regularity.  We elected to postpone scar revision and can have her see Dr. Dossie Der to evaluate for umbilical hernia.  If it turns out that he agrees she has a small umbilical hernia defect I may do a small scar revision at that time to spare her the local procedure today.  All of her questions were answered and she agrees with the plan.

## 2021-07-26 ENCOUNTER — Other Ambulatory Visit: Payer: Self-pay | Admitting: Family

## 2021-07-26 MED ORDER — MUPIROCIN 2 % EX OINT: 1.0000 | TOPICAL_OINTMENT | Freq: Two times a day (BID) | CUTANEOUS | 0 refills | Status: DC

## 2021-08-14 ENCOUNTER — Encounter: Payer: Self-pay | Admitting: Family

## 2021-08-16 ENCOUNTER — Other Ambulatory Visit (HOSPITAL_COMMUNITY): Payer: Self-pay

## 2021-08-16 MED ORDER — TRETINOIN 0.025 % EX GEL
Freq: Every day | CUTANEOUS | 1 refills | Status: DC
  Filled 2021-08-16 – 2021-08-24 (×2): qty 45, 30d supply, fill #0
  Filled 2021-11-24: qty 45, 30d supply, fill #1

## 2021-08-17 ENCOUNTER — Telehealth: Payer: Self-pay

## 2021-08-17 NOTE — Telephone Encounter (Signed)
PA initiated via Covermymeds; KEY: [id number]. PA approved.   The request has been approved. The authorization is effective for a maximum of 12 fills from 08/16/2021 to 08/16/2022, as long as the member is enrolled in their current health plan. The request was approved as submitted. A written notification letter will follow with additional details.

## 2021-08-24 ENCOUNTER — Other Ambulatory Visit (HOSPITAL_COMMUNITY): Payer: Self-pay

## 2021-08-25 ENCOUNTER — Other Ambulatory Visit (HOSPITAL_COMMUNITY): Payer: Self-pay

## 2021-10-13 ENCOUNTER — Ambulatory Visit (INDEPENDENT_AMBULATORY_CARE_PROVIDER_SITE_OTHER): Payer: No Typology Code available for payment source | Admitting: Family Medicine

## 2021-10-13 ENCOUNTER — Encounter: Payer: Self-pay | Admitting: Family Medicine

## 2021-10-13 ENCOUNTER — Other Ambulatory Visit (HOSPITAL_BASED_OUTPATIENT_CLINIC_OR_DEPARTMENT_OTHER): Payer: Self-pay

## 2021-10-13 ENCOUNTER — Ambulatory Visit: Payer: Self-pay

## 2021-10-13 ENCOUNTER — Ambulatory Visit (INDEPENDENT_AMBULATORY_CARE_PROVIDER_SITE_OTHER): Payer: No Typology Code available for payment source | Admitting: Family

## 2021-10-13 VITALS — BP 110/60 | HR 62 | Temp 98.1°F | Resp 18 | Ht 65.0 in | Wt 164.6 lb

## 2021-10-13 VITALS — BP 118/82 | HR 68 | Ht 65.0 in | Wt 164.0 lb

## 2021-10-13 DIAGNOSIS — M25562 Pain in left knee: Secondary | ICD-10-CM

## 2021-10-13 DIAGNOSIS — G8929 Other chronic pain: Secondary | ICD-10-CM | POA: Insufficient documentation

## 2021-10-13 DIAGNOSIS — S83242A Other tear of medial meniscus, current injury, left knee, initial encounter: Secondary | ICD-10-CM

## 2021-10-13 MED ORDER — MELOXICAM 15 MG PO TABS
15.0000 mg | ORAL_TABLET | Freq: Every day | ORAL | 0 refills | Status: DC
  Filled 2021-10-13: qty 20, 20d supply, fill #0

## 2021-10-13 NOTE — Assessment & Plan Note (Signed)
Patient does have a posterior medial meniscal tear noted.  Appears to be acute on chronic.  Aspiration done today which patient responded very well to.  Patient had improvement in range of motion almost immediately.  We discussed home exercises, icing and topical anti-inflammatory, follow-up with me again in 6 to 8 weeks.

## 2021-10-13 NOTE — Progress Notes (Signed)
[facility] [place] Phone: [phone] Subjective:    I'm seeing this patient by the request  of:  Debbrah Alar, NP  CC: Left knee pain  [id number]  Janet Holmes is a 36 y.o. female coming in with complaint of L knee pain. Patient states that patient does Crossfit and she feels like over past 2 weeks she has had limited ROM when squatting. More tightness in the front of knee. Tried ice, Voltaren. History of lateral tendon release in R knee but no hx of injury to L knee.       Past Medical History:  Diagnosis Date   Anemia    Chicken pox    Clomid pregnancy    Condyloma acuminatum    H/O varicella    Migraines    Polycystic ovaries    Polyhydramnios in third trimester for IOL 09/27/2011   Polyhydramnios, antepartum complication    Postpartum care following vaginal delivery (9/17, 2nd deg perineal lac) 09/28/2011   Past Surgical History:  Procedure Laterality Date   CESAREAN SECTION N/A 03/12/2015   Procedure: CESAREAN SECTION;  Surgeon: Brien Few, MD;  Location: [facility];  Service: Obstetrics;  Laterality: N/A;   COMBINED ABDOMINOPLASTY AND LIPOSUCTION     with repair of rectus diastasis   KNEE ARTHROSCOPY     R   MOLE REMOVAL     WISDOM TOOTH EXTRACTION     Social History   Socioeconomic History   Marital status: Married    Spouse name: JOEY   Number of children: 2   Years of education: Not on file   Highest education level: Not on file  Occupational History   Occupation: NP  Tobacco Use   Smoking status: Former    Types: Cigarettes    Quit date: 05/17/2005    Years since quitting: 16.4   Smokeless tobacco: Never  Vaping Use   Vaping Use: Never used  Substance and Sexual Activity   Alcohol use: No   Drug use: No   Sexual activity: Yes    Partners: Male    Birth control/protection: None, I.U.D.  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, used to ride [employer] competitively, reading,    One dog and one Neurosurgeon   Social Determinants of Radio broadcast assistant Strain: Low Risk  (05/17/2017)   Overall Financial Resource Strain (CARDIA)    Difficulty of Paying Living Expenses: Not hard at all  Food Insecurity: No Food Insecurity (05/17/2017)   Hunger Vital Sign    Worried About Running Out of Food in the Last Year: Never true    [place] in the Last Year: Never true  Transportation Needs: Unknown (05/17/2017)   PRAPARE - Hydrologist (Medical): Not on file    Lack of Transportation (Non-Medical): No  Physical Activity: Sufficiently Active (05/17/2017)   Exercise Vital Sign    Days of Exercise per Week: 4 days    Minutes of Exercise per Session: 70 min  Stress: Stress Concern Present (05/17/2017)   [place]    Feeling of Stress : To some extent  Social Connections: Moderately Integrated (05/17/2017)   Social  Connection and Isolation Panel [NHANES]    Frequency of Communication with Friends and Family: Three times a week    Frequency of Social Gatherings with Friends and Family: Once a week    Attends Religious Services: Never    Database administrator or Organizations: Yes    Attends Engineer, structural: More than 4 times per year    Marital Status: Married   Allergies  Allergen Reactions   Polytrim [Polymyxin B-Trimethoprim] Other (See Comments)    Eye swelling   Family History  Problem Relation Age of Onset   Cancer Paternal Grandmother        breast, skin   Hypertension Paternal Grandmother    Stroke Paternal Grandmother    Stroke Paternal Grandfather    Hypertension Paternal Grandfather    Diabetes Paternal Grandfather    Cancer Mother        skin (basal cell carcinoma)   Varicose  Veins Mother    Cancer Maternal Grandmother        breast   Hypertension Maternal Grandfather    Stroke Maternal Grandfather    COPD Father        former smoker   Depression Father    Other Sister        frontal lobe disorder (hx of 6 concussions)   Anxiety disorder Brother        hx substance/alcohol abuse    Current Outpatient Medications (Endocrine & Metabolic):    levonorgestrel (MIRENA) 20 MCG/24HR IUD, 1 each by Intrauterine route once.    Current Outpatient Medications (Analgesics):    meloxicam (MOBIC) 15 MG tablet, Take 1 tablet (15 mg total) by mouth daily.   SUMAtriptan (IMITREX) 50 MG tablet, Take one tablet by mouth at start of migraine, may repeat in 2 hours if symptoms not improved   Current Outpatient Medications (Other):    betamethasone valerate ointment (VALISONE) 0.1 %, Apply 1 application topically 2 (two) times daily.   cholecalciferol (VITAMIN D) 1000 units tablet, Take 1,000 Units by mouth daily.   mupirocin ointment (BACTROBAN) 2 %, Apply 1 Application topically 2 (two) times daily.   tretinoin (RETIN-A) 0.025 % gel, Apply topically at bedtime.   Reviewed prior external information including notes and imaging from  primary care provider As well as notes that were available from care everywhere and other healthcare systems.  Past medical history, social, surgical and family history all reviewed in electronic medical record.  No pertanent information unless stated regarding to the chief complaint.   Review of Systems:  No headache, visual changes, nausea, vomiting, diarrhea, constipation, dizziness, abdominal pain, skin rash, fevers, chills, night sweats, weight loss, swollen lymph nodes, body aches, joint swelling, chest pain, shortness of breath, mood changes. POSITIVE muscle aches  Objective  Blood pressure 118/82, pulse 68, height 5\' 5"  (1.651 m), weight 164 lb (74.4 kg), SpO2 98 %, unknown if currently breastfeeding.   General: No apparent  distress alert and oriented x3 mood and affect normal, dressed appropriately.  HEENT: Pupils equal, extraocular movements intact  Respiratory: Patient's speak in full sentences and does not appear short of breath  Cardiovascular: No lower extremity edema, non tender, no erythema  Left knee exam shows the patient does have a large effusion noted compared to the contralateral side.  Mild crepitus noted with range of motion.  Mild lateral tracking of the patella.  Tenderness over the medial joint line.  Limited muscular skeletal ultrasound was performed and interpreted by , M  Limited ultrasound shows hypoechoic changes and a large effusion noted of the joint.  Patient does have what appears to be an acute on chronic medial meniscal tear with 25% redisplacement mostly posteriorly.  Mild Baker's cyst noted as well. Impression: Acute on chronic meniscal tear with large effusion  Procedure: Real-time Ultrasound Guided Injection of left knee Device: GE Logiq Q7 Ultrasound guided injection is preferred based studies that show increased duration, increased effect, greater accuracy, decreased procedural pain, increased response rate, and decreased cost with ultrasound guided versus blind injection.  Verbal informed consent obtained.  Time-out conducted.  Noted no overlying erythema, induration, or other signs of local infection.  Skin prepped in a sterile fashion.  Local anesthesia: Topical Ethyl chloride.  With sterile technique and under real time ultrasound guidance: With a 22-gauge 2 inch needle patient was injected with 4 cc of 0.5% Marcaine and abraded 35 cc of straw-colored fluid then injected 1 cc of Kenalog 40 mg/dL. This was from a superior lateral approach.  Completed without difficulty  Pain immediately resolved suggesting accurate placement of the medication.  Advised to call if fevers/chills, erythema, induration, drainage, or persistent bleeding.  Impression: Technically  successful ultrasound guided injection.    Impression and Recommendations:     The above documentation has been reviewed and is accurate and complete Judi Saa, DO

## 2021-10-13 NOTE — Patient Instructions (Signed)
Please continue icing, rest. Start meloxicam once daily. You should be contacted about your appointment with Dr. Tamala Julian.

## 2021-10-13 NOTE — Patient Instructions (Signed)
Good to see you Drained knee today and put in steroid Exercises Ice  Voltaren Avoid twisting See me in 6 weeks

## 2021-10-13 NOTE — Progress Notes (Signed)
Subjective:     Patient ID: Janet Holmes, female    DOB: [birth year], 36 y.o.   MRN: [id number]  Chief Complaint  Patient presents with   Knee Pain    Left onset: 1.5 week average pain level 7/10    Knee Pain    Patient is in today with chief complaint of left sided knee pain. Soreness began about 10 years ago.  Noted trouble squatting.  Had a tightness/pulling.  Now notes that she has restricted ROM. She has been using ibuprofen, icing.  She just sarted resting her knee.  Health Maintenance Due  Topic Date Due   PAP SMEAR-Modifier  07/12/2019   COVID-19 Vaccine (4 - Pfizer series) 02/21/2020    Past Medical History:  Diagnosis Date   Anemia    Chicken pox    Clomid pregnancy    Condyloma acuminatum    H/O varicella    Migraines    Polycystic ovaries    Polyhydramnios in third trimester for IOL 09/27/2011   Polyhydramnios, antepartum complication    Postpartum care following vaginal delivery (9/17, 2nd deg perineal lac) 09/28/2011    Past Surgical History:  Procedure Laterality Date   CESAREAN SECTION N/A 03/12/2015   Procedure: CESAREAN SECTION;  Surgeon: Brien Few, MD;  Location: [facility];  Service: Obstetrics;  Laterality: N/A;   COMBINED ABDOMINOPLASTY AND LIPOSUCTION     with repair of rectus diastasis   KNEE ARTHROSCOPY     R   MOLE REMOVAL     WISDOM TOOTH EXTRACTION      Family History  Problem Relation Age of Onset   Cancer Paternal Grandmother        breast, skin   Hypertension Paternal Grandmother    Stroke Paternal Grandmother    Stroke Paternal Grandfather    Hypertension Paternal Grandfather    Diabetes Paternal Grandfather    Cancer Mother        skin (basal cell carcinoma)   Varicose Veins Mother    Cancer Maternal Grandmother        breast   Hypertension Maternal Grandfather    Stroke Maternal Grandfather    COPD Father        former smoker   Depression Father    Other Sister        frontal lobe disorder (hx of 6 concussions)    Anxiety disorder Brother        hx substance/alcohol abuse    Social History   Socioeconomic History   Marital status: Married    Spouse name: JOEY   Number of children: 2   Years of education: Not on file   Highest education level: Not on file  Occupational History   Occupation: NP  Tobacco Use   Smoking status: Former    Types: Cigarettes    Quit date: 05/17/2005    Years since quitting: 16.4   Smokeless tobacco: Never  Vaping Use   Vaping Use: Never used  Substance and Sexual Activity   Alcohol use: No   Drug use: No   Sexual activity: Yes    Partners: Male    Birth control/protection: None, I.U.D.  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, used to ride [employer] competitively, reading,    One dog and  one cat   Social Determinants of Health   Financial Resource Strain: Low Risk  (05/17/2017)   Overall Financial Resource Strain (CARDIA)    Difficulty of Paying Living Expenses: Not hard at all  Food Insecurity: No Food Insecurity (05/17/2017)   Hunger Vital Sign    Worried About Running Out of Food in the Last Year: Never true    Ran Out of Food in the Last Year: Never true  Transportation Needs: Unknown (05/17/2017)   PRAPARE - Administrator, Civil Service (Medical): Not on file    Lack of Transportation (Non-Medical): No  Physical Activity: Sufficiently Active (05/17/2017)   Exercise Vital Sign    Days of Exercise per Week: 4 days    Minutes of Exercise per Session: 70 min  Stress: Stress Concern Present (05/17/2017)   [employer] of Occupational Health - Occupational Stress Questionnaire    Feeling of Stress : To some extent  Social Connections: Moderately Integrated (05/17/2017)   Social Connection and Isolation Panel [NHANES]    Frequency of Communication with Friends and Family: Three  times a week    Frequency of Social Gatherings with Friends and Family: Once a week    Attends Religious Services: Never    Database administrator or Organizations: Yes    Attends Engineer, structural: More than 4 times per year    Marital Status: Married  Catering manager Violence: Not At Risk (05/17/2017)   Humiliation, Afraid, Rape, and Kick questionnaire    Fear of Current or Ex-Partner: No    Emotionally Abused: No    Physically Abused: No    Sexually Abused: No    Outpatient Medications Prior to Visit  Medication Sig Dispense Refill   betamethasone valerate ointment (VALISONE) 0.1 % Apply 1 application topically 2 (two) times daily. 30 g 1   cholecalciferol (VITAMIN D) 1000 units tablet Take 1,000 Units by mouth daily.     levonorgestrel (MIRENA) 20 MCG/24HR IUD 1 each by Intrauterine route once.     mupirocin ointment (BACTROBAN) 2 % Apply 1 Application topically 2 (two) times daily. 22 g 0   SUMAtriptan (IMITREX) 50 MG tablet Take one tablet by mouth at start of migraine, may repeat in 2 hours if symptoms not improved 10 tablet 5   tretinoin (RETIN-A) 0.025 % gel Apply topically at bedtime. 45 g 1   naproxen (NAPROSYN) 250 MG tablet Take by mouth 2 (two) times daily with a meal.     celecoxib (CELEBREX) 200 MG capsule Take 1 capsule (200 mg total) by mouth 2 (two) times daily. 60 capsule 2   No facility-administered medications prior to visit.    Allergies  Allergen Reactions   Polytrim [Polymyxin B-Trimethoprim] Other (See Comments)    Eye swelling    ROS     Objective:    Physical Exam Constitutional:      General: She is not in acute distress.    Appearance: Normal appearance. She is well-developed.  HENT:     Head: Normocephalic and atraumatic.     Right Ear: External ear normal.     Left Ear: External ear normal.  Eyes:     General: No scleral icterus. Neck:     Thyroid: No thyromegaly.  Cardiovascular:     Rate and Rhythm: Normal rate and  regular rhythm.     Heart sounds: Normal heart sounds. No murmur heard. Pulmonary:     Effort: Pulmonary effort is normal. No respiratory distress.  Breath sounds: Normal breath sounds. No wheezing.  Musculoskeletal:     Cervical back: Neck supple.     Right knee: Normal.     Left knee: Effusion (small effusion noted) present. No tenderness. No ACL laxity.    Instability Tests: Anterior drawer test negative.  Skin:    General: Skin is warm and dry.  Neurological:     Mental Status: She is alert and oriented to person, place, and time.  Psychiatric:        Mood and Affect: Mood normal.        Behavior: Behavior normal.        Thought Content: Thought content normal.        Judgment: Judgment normal.     BP 110/60   Pulse 62   Temp 98.1 F (36.7 C)   Resp 18   Ht 5\' 5"  (1.651 m)   Wt 164 lb 9.6 oz (74.7 kg)   SpO2 99%   BMI 27.39 kg/m  Wt Readings from Last 3 Encounters:  10/13/21 164 lb 9.6 oz (74.7 kg)  06/24/21 160 lb 9.6 oz (72.8 kg)  06/04/21 155 lb 12.8 oz (70.7 kg)       Assessment & Plan:   Problem List Items Addressed This Visit       Unprioritized   Acute pain of left knee - Primary    New. Will rx with meloxicam. Continue rest/ice. Will also refer to sports medicine for further evaluation.       Relevant Orders   Ambulatory referral to Sports Medicine    I have discontinued 06/06/21. Warrell's naproxen and celecoxib. I am also having her start on meloxicam. Additionally, I am having her maintain her levonorgestrel, cholecalciferol, betamethasone valerate ointment, SUMAtriptan, mupirocin ointment, and tretinoin.  Meds ordered this encounter  Medications   meloxicam (MOBIC) 15 MG tablet    Sig: Take 1 tablet (15 mg total) by mouth daily.    Dispense:  20 tablet    Refill:  0    Order Specific Question:   Supervising Provider    Answer:   [facility] Cleverly A [[id number]]

## 2021-10-13 NOTE — Assessment & Plan Note (Signed)
New. Will rx with meloxicam. Continue rest/ice. Will also refer to sports medicine for further evaluation.

## 2021-10-14 LAB — SYNOVIAL FLUID ANALYSIS, COMPLETE
Basophils, %: 0 %
Eosinophils-Synovial: 0 % (ref 0–2)
Lymphocytes-Synovial Fld: 80 % — ABNORMAL HIGH (ref 0–74)
Monocyte/Macrophage: 7 % (ref 0–69)
Neutrophil, Synovial: 13 % (ref 0–24)
Synoviocytes, %: 0 % (ref 0–15)
WBC, Synovial: 802 cells/uL — ABNORMAL HIGH (ref <150)

## 2021-10-25 LAB — HM PAP SMEAR: HM Pap smear: NEGATIVE

## 2021-10-29 ENCOUNTER — Encounter: Payer: Self-pay | Admitting: Family

## 2021-11-03 ENCOUNTER — Other Ambulatory Visit: Payer: Self-pay | Admitting: Family

## 2021-11-03 ENCOUNTER — Other Ambulatory Visit: Payer: Self-pay

## 2021-11-03 MED ORDER — MELOXICAM 15 MG PO TABS
15.0000 mg | ORAL_TABLET | Freq: Every day | ORAL | 1 refills | Status: DC
  Filled 2021-11-03: qty 30, 30d supply, fill #0
  Filled 2022-01-07 – 2022-05-04 (×2): qty 30, 30d supply, fill #1

## 2021-11-08 ENCOUNTER — Encounter: Payer: Self-pay | Admitting: Family Medicine

## 2021-11-09 ENCOUNTER — Other Ambulatory Visit: Payer: Self-pay

## 2021-11-09 DIAGNOSIS — M25562 Pain in left knee: Secondary | ICD-10-CM

## 2021-11-11 ENCOUNTER — Encounter: Payer: Self-pay | Admitting: Family Medicine

## 2021-11-16 ENCOUNTER — Ambulatory Visit
Admission: RE | Admit: 2021-11-16 | Discharge: 2021-11-16 | Disposition: A | Discharge status: Home / Self Care | Payer: No Typology Code available for payment source | Source: Ambulatory Visit | Attending: Family Medicine | Admitting: Family Medicine

## 2021-11-16 DIAGNOSIS — M25562 Pain in left knee: Secondary | ICD-10-CM

## 2021-11-18 ENCOUNTER — Other Ambulatory Visit: Payer: Self-pay

## 2021-11-18 ENCOUNTER — Encounter: Payer: Self-pay | Admitting: Family Medicine

## 2021-11-18 ENCOUNTER — Other Ambulatory Visit (HOSPITAL_COMMUNITY): Payer: Self-pay

## 2021-11-18 DIAGNOSIS — M859 Disorder of bone density and structure, unspecified: Secondary | ICD-10-CM

## 2021-11-18 MED ORDER — VITAMIN D (ERGOCALCIFEROL) 1.25 MG (50000 UNIT) PO CAPS
50000.0000 [IU] | ORAL_CAPSULE | ORAL | 0 refills | Status: DC
  Filled 2021-11-18: qty 12, 84d supply, fill #0

## 2021-11-18 NOTE — Progress Notes (Signed)
[name] [address] [place] [address] Phone: [phone] Subjective:   Janet Holmes, am serving as a scribe for Dr. Antoine Primas.  I'm seeing this patient by the request  of:  Sandford Craze, NP  CC: Left knee pain  [id number]  10/13/2021 Patient does have a posterior medial meniscal tear noted.  Appears to be acute on chronic.  Aspiration done today which patient responded very well to.  Patient had improvement in range of motion almost immediately.  We discussed home exercises, icing and topical anti-inflammatory, follow-up with me again in 6 to 8 weeks.     Update 11/23/2021 Janet Holmes is a 36 y.o. female coming in with complaint of L knee pain. Patient states that her knee is doing okay, has a brace she has been wearing and would like to know if it is neccessary or okay. The injection worked for about 3 weeks and then the pain came back as a toothache pain in the patella. ROM and swelling a lot better.       Past Medical History:  Diagnosis Date   Anemia    Chicken pox    Clomid pregnancy    Condyloma acuminatum    H/O varicella    Migraines    Polycystic ovaries    Polyhydramnios in third trimester for IOL 09/27/2011   Polyhydramnios, antepartum complication    Postpartum care following vaginal delivery (9/17, 2nd deg perineal lac) 09/28/2011   Past Surgical History:  Procedure Laterality Date   CESAREAN SECTION N/A 03/12/2015   Procedure: CESAREAN SECTION;  Surgeon: Olivia Mackie, MD;  Location: [facility];  Service: Obstetrics;  Laterality: N/A;   COMBINED ABDOMINOPLASTY AND LIPOSUCTION     with repair of rectus diastasis   KNEE ARTHROSCOPY     R   MOLE REMOVAL     WISDOM TOOTH EXTRACTION     Social History   Socioeconomic History   Marital status: Married    Spouse name: JOEY   Number of children: 2   Years of education: Not on file   Highest education level: Not on file  Occupational History    Occupation: NP  Tobacco Use   Smoking status: Former    Types: Cigarettes    Quit date: 05/17/2005    Years since quitting: 16.5   Smokeless tobacco: Never  Vaping Use   Vaping Use: Never used  Substance and Sexual Activity   Alcohol use: No   Drug use: No   Sexual activity: Yes    Partners: Male    Birth control/protection: None, I.U.D.  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, used to ride [employer] competitively, reading,    One dog and one Medical laboratory scientific officer   Social Determinants of Corporate investment banker Strain: Low Risk  (05/17/2017)   Overall Financial Resource Strain (CARDIA)    Difficulty of Paying Living Expenses: Not hard at all  Food Insecurity: No Food Insecurity (05/17/2017)   Hunger Vital Sign    Worried About Running Out of Food in the Last Year: Never true    Ran Out of Food in the Last Year: Never true  Transportation Needs: Unknown (05/17/2017)   PRAPARE - Administrator, Civil Service (Medical):  Not on file    Lack of Transportation (Non-Medical): No  Physical Activity: Sufficiently Active (05/17/2017)   Exercise Vital Sign    Days of Exercise per Week: 4 days    Minutes of Exercise per Session: 70 min  Stress: Stress Concern Present (05/17/2017)   [employer] of Occupational Health - Occupational Stress Questionnaire    Feeling of Stress : To some extent  Social Connections: Moderately Integrated (05/17/2017)   Social Connection and Isolation Panel [NHANES]    Frequency of Communication with Friends and Family: Three times a week    Frequency of Social Gatherings with Friends and Family: Once a week    Attends Religious Services: Never    Database administrator or Organizations: Yes    Attends Engineer, structural: More than 4 times per year    Marital Status: Married   Allergies   Allergen Reactions   Polytrim [Polymyxin B-Trimethoprim] Other (See Comments)    Eye swelling   Family History  Problem Relation Age of Onset   Cancer Paternal Grandmother        breast, skin   Hypertension Paternal Grandmother    Stroke Paternal Grandmother    Stroke Paternal Grandfather    Hypertension Paternal Grandfather    Diabetes Paternal Grandfather    Cancer Mother        skin (basal cell carcinoma)   Varicose Veins Mother    Cancer Maternal Grandmother        breast   Hypertension Maternal Grandfather    Stroke Maternal Grandfather    COPD Father        former smoker   Depression Father    Other Sister        frontal lobe disorder (hx of 6 concussions)   Anxiety disorder Brother        hx substance/alcohol abuse       Current Outpatient Medications (Analgesics):    meloxicam (MOBIC) 15 MG tablet, Take 1 tablet (15 mg total) by mouth daily.   SUMAtriptan (IMITREX) 50 MG tablet, Take one tablet by mouth at start of migraine, may repeat in 2 hours if symptoms not improved   Current Outpatient Medications (Other):    betamethasone valerate ointment (VALISONE) 0.1 %, Apply 1 application topically 2 (two) times daily.   mupirocin ointment (BACTROBAN) 2 %, Apply 1 Application topically 2 (two) times daily.   tretinoin (RETIN-A) 0.025 % gel, Apply topically at bedtime.   Vitamin D, Ergocalciferol, (DRISDOL) 1.25 MG (50000 UNIT) CAPS capsule, Take 1 capsule (50,000 Units total) by mouth every 7 (seven) days.   Reviewed prior external information including notes and imaging from  primary care provider As well as notes that were available from care everywhere and other healthcare systems.  Past medical history, social, surgical and family history all reviewed in electronic medical record.  No pertanent information unless stated regarding to the chief complaint.   Review of Systems:  No headache, visual changes, nausea, vomiting, diarrhea, constipation, dizziness,  abdominal pain, skin rash, fevers, chills, night sweats, weight loss, swollen lymph nodes, body aches, joint swelling, chest pain, shortness of breath, mood changes. POSITIVE muscle aches  Objective  Blood pressure 110/70, pulse (!) 52, height 5\' 5"  (1.651 m), weight 160 lb (72.6 kg), SpO2 98 %, unknown if currently breastfeeding.   General: No apparent distress alert and oriented x3 mood and affect normal, dressed appropriately.  HEENT: Pupils equal, extraocular movements intact  Respiratory: Patient's speak in full sentences and does  not appear short of breath  Cardiovascular: No lower extremity edema, non tender, no erythema  Left knee exam does have some mild tenderness to palpation tightness in the hips bilaterally. Patient is tender over the medial aspect of the left knee.  No significant swelling.  Full range of motion. Low back does have some tightness noted in the paraspinal musculature  Limited muscular skeletal ultrasound was performed and interpreted by Antoine Primas, M  Limited ultrasound of patient's knee over the tibia of the peripheral aspect does have a cortical irregularity with increasing in neovascularization.  Patient has 1 area that he can does have what appears to be more of a callus formation noted. Impression: Cortical irregularity  Osteopathic findings  T9 extended rotated and side bent left L2 flexed rotated and side bent right L5 flexed rotated and side bent left Sacrum right on right    Impression and Recommendations:      The above documentation has been reviewed and is accurate and complete Judi Saa, DO

## 2021-11-19 ENCOUNTER — Other Ambulatory Visit (HOSPITAL_COMMUNITY): Payer: Self-pay

## 2021-11-19 ENCOUNTER — Other Ambulatory Visit: Payer: Self-pay

## 2021-11-24 ENCOUNTER — Encounter: Payer: Self-pay | Admitting: Family Medicine

## 2021-11-24 ENCOUNTER — Ambulatory Visit: Payer: Self-pay

## 2021-11-24 ENCOUNTER — Other Ambulatory Visit: Payer: Self-pay

## 2021-11-24 ENCOUNTER — Ambulatory Visit (INDEPENDENT_AMBULATORY_CARE_PROVIDER_SITE_OTHER): Payer: No Typology Code available for payment source | Admitting: Family Medicine

## 2021-11-24 VITALS — BP 110/70 | HR 52 | Ht 65.0 in | Wt 160.0 lb

## 2021-11-24 DIAGNOSIS — M9904 Segmental and somatic dysfunction of sacral region: Secondary | ICD-10-CM

## 2021-11-24 DIAGNOSIS — M25562 Pain in left knee: Secondary | ICD-10-CM

## 2021-11-24 DIAGNOSIS — M9903 Segmental and somatic dysfunction of lumbar region: Secondary | ICD-10-CM

## 2021-11-24 DIAGNOSIS — M24551 Contracture, right hip: Secondary | ICD-10-CM | POA: Diagnosis not present

## 2021-11-24 DIAGNOSIS — M9902 Segmental and somatic dysfunction of thoracic region: Secondary | ICD-10-CM | POA: Diagnosis not present

## 2021-11-24 DIAGNOSIS — M84369A Stress fracture, unspecified tibia and fibula, initial encounter for fracture: Secondary | ICD-10-CM | POA: Insufficient documentation

## 2021-11-24 DIAGNOSIS — M24559 Contracture, unspecified hip: Secondary | ICD-10-CM | POA: Insufficient documentation

## 2021-11-24 NOTE — Assessment & Plan Note (Signed)

## 2021-11-24 NOTE — Assessment & Plan Note (Addendum)
Patient does have an insufficiency fracture noted of the medial tibial plateau.  The patient does do a significant amount of lifting.  We discussed with patient about icing regimen and home exercises.  We discussed with patient to avoid any high impact for now.  Continue the vitamin D and K2 supplementation we discussed after patient did have her imaging.  Patient has been doing it and is feeling better.  States that the swelling almost improved immediately.  Discussed posture and ergonomics otherwise.  We discussed upper body.  Follow-up again in 4 to 6 weeks for further evaluation.  Awaiting DEXA at this point as well.

## 2021-11-24 NOTE — Assessment & Plan Note (Signed)
Hip flexor tightness noted.  Discussed which activities to do which ones to avoid.  Discussed with patient about posture and ergonomics otherwise.  Patient will watch lifting techniques.  Follow-up again in 6 to 8 weeks.

## 2021-11-24 NOTE — Patient Instructions (Signed)
Looks good We can follow it by [place] Continue Vit D and K2  You can get upper body swoll Follow up 6-8 weeks

## 2021-11-25 ENCOUNTER — Other Ambulatory Visit: Payer: Self-pay

## 2021-11-26 ENCOUNTER — Encounter: Payer: Self-pay | Admitting: Radiology

## 2021-11-26 ENCOUNTER — Ambulatory Visit (INDEPENDENT_AMBULATORY_CARE_PROVIDER_SITE_OTHER)
Admission: RE | Admit: 2021-11-26 | Discharge: 2021-11-26 | Disposition: A | Discharge status: Home / Self Care | Payer: No Typology Code available for payment source | Source: Ambulatory Visit

## 2021-11-26 DIAGNOSIS — M859 Disorder of bone density and structure, unspecified: Secondary | ICD-10-CM

## 2021-12-29 NOTE — Progress Notes (Signed)
[name] [address] [place] [address] Phone: [phone] Subjective:    I'm seeing this patient by the request  of:  Janet Craze, NP  CC: Left knee pain and back pain  [id number]  Janet Holmes is a 36 y.o. female coming in with complaint of back and neck pain. OMT 11/24/2021. Also f/u for L knee pain. Patient states that she is good no pain for 2 weeks, new clunking noise but overall better   Medications patient has been prescribed: Vit D  Taking:         Reviewed prior external information including notes and imaging from previsou exam, outside providers and external EMR if available.   As well as notes that were available from care everywhere and other healthcare systems.  Past medical history, social, surgical and family history all reviewed in electronic medical record.  No pertanent information unless stated regarding to the chief complaint.   Past Medical History:  Diagnosis Date   Anemia    Chicken pox    Clomid pregnancy    Condyloma acuminatum    H/O varicella    Migraines    Polycystic ovaries    Polyhydramnios in third trimester for IOL 09/27/2011   Polyhydramnios, antepartum complication    Postpartum care following vaginal delivery (9/17, 2nd deg perineal lac) 09/28/2011    Allergies  Allergen Reactions   Polytrim [Polymyxin B-Trimethoprim] Other (See Comments)    Eye swelling     Review of Systems:  No headache, visual changes, nausea, vomiting, diarrhea, constipation, dizziness, abdominal pain, skin rash, fevers, chills, night sweats, weight loss, swollen lymph nodes, body aches, joint swelling, chest pain, shortness of breath, mood changes. POSITIVE muscle aches  Objective  Blood pressure 110/80, pulse 75, height 5\' 5"  (1.651 m), weight 163 lb (73.9 kg), SpO2 97 %, unknown if currently breastfeeding.   General: No apparent distress alert and oriented x3 mood and affect normal, dressed  appropriately.  HEENT: Pupils equal, extraocular movements intact  Respiratory: Patient's speak in full sentences and does not appear short of breath  Cardiovascular: No lower extremity edema, non tender, no erythema  Low back exam does have some mild loss of lordosis.  Still has tightness of the hip flexors bilaterally.  Patient does have some tightness with FABER test left greater than right.  Left knee no tenderness noted.  Crepitus of the patellofemoral joint noted as well.  Osteopathic findings  C6 flexed rotated and side bent left T3 extended rotated and side bent right inhaled rib L2 flexed rotated and side bent right Sacrum right on right   Limited muscular skeletal ultrasound was performed and interpreted by , M  Limited ultrasound of patient's left knee does not show any cortical irregularity noted, no hypoechoic changes that would be consistent with any type of inflammation at the moment.    Assessment and Plan:  Stress fracture of lower leg I believe the patient is doing okay.  Likely is healing overall.  Able to start increasing activity.  We will continue to monitor.  Will follow-up with me again in 2 to 3 months  Hip flexor tendon tightness Continue tightness.  Discussed posture and movements, discussed which activities to do and which ones to avoid.  Patient will start increasing activity and we discussed stretching otherwise and some of the muscle imbalances.  Follow-up again in 6 to 8 weeks    Nonallopathic problems  Decision today to treat with OMT was based  on Physical Exam  After verbal consent patient was treated with HVLA, ME, FPR techniques in cervical, rib, thoracic, lumbar, and sacral  areas  Patient tolerated the procedure well with improvement in symptoms  Patient given exercises, stretches and lifestyle modifications  See medications in patient instructions if given  Patient will follow up in 4-8 weeks     The above documentation  has been reviewed and is accurate and complete Judi Saa, DO         Note: This dictation was prepared with Dragon dictation along with smaller phrase technology. Any transcriptional errors that result from this process are unintentional.

## 2022-01-05 ENCOUNTER — Ambulatory Visit: Payer: Self-pay

## 2022-01-05 ENCOUNTER — Ambulatory Visit (INDEPENDENT_AMBULATORY_CARE_PROVIDER_SITE_OTHER): Payer: No Typology Code available for payment source | Admitting: Family Medicine

## 2022-01-05 ENCOUNTER — Encounter: Payer: Self-pay | Admitting: Family Medicine

## 2022-01-05 VITALS — BP 110/80 | HR 75 | Ht 65.0 in | Wt 163.0 lb

## 2022-01-05 DIAGNOSIS — M9903 Segmental and somatic dysfunction of lumbar region: Secondary | ICD-10-CM | POA: Diagnosis not present

## 2022-01-05 DIAGNOSIS — M9902 Segmental and somatic dysfunction of thoracic region: Secondary | ICD-10-CM | POA: Diagnosis not present

## 2022-01-05 DIAGNOSIS — M84369D Stress fracture, unspecified tibia and fibula, subsequent encounter for fracture with routine healing: Secondary | ICD-10-CM

## 2022-01-05 DIAGNOSIS — M9904 Segmental and somatic dysfunction of sacral region: Secondary | ICD-10-CM

## 2022-01-05 DIAGNOSIS — M24551 Contracture, right hip: Secondary | ICD-10-CM

## 2022-01-05 DIAGNOSIS — M25562 Pain in left knee: Secondary | ICD-10-CM

## 2022-01-05 NOTE — Patient Instructions (Addendum)
Looks fantastic overall  Start kicking butt at cross fit Limit high impact and deep and squats  2 month follow u

## 2022-01-05 NOTE — Assessment & Plan Note (Signed)
I believe the patient is doing okay.  Likely is healing overall.  Able to start increasing activity.  We will continue to monitor.  Will follow-up with me again in 2 to 3 months

## 2022-01-05 NOTE — Assessment & Plan Note (Signed)
Continue tightness.  Discussed posture and movements, discussed which activities to do and which ones to avoid.  Patient will start increasing activity and we discussed stretching otherwise and some of the muscle imbalances.  Follow-up again in 6 to 8 weeks

## 2022-01-11 ENCOUNTER — Other Ambulatory Visit (HOSPITAL_COMMUNITY): Payer: Self-pay

## 2022-01-11 ENCOUNTER — Encounter (HOSPITAL_COMMUNITY): Payer: Self-pay

## 2022-01-13 ENCOUNTER — Other Ambulatory Visit: Payer: Self-pay

## 2022-03-10 NOTE — Progress Notes (Deleted)
  [place] Phone: [phone] Subjective:    I'm seeing this patient by the request  of:  Debbrah Alar, NP  CC:   [id number]  FARTUN CAMERER is a 37 y.o. female coming in with complaint of back and neck pain. OMT 01/05/2022. L knee pain. Patient states   Medications patient has been prescribed: Vit D  Taking:         Reviewed prior external information including notes and imaging from previsou exam, outside providers and external EMR if available.   As well as notes that were available from care everywhere and other healthcare systems.  Past medical history, social, surgical and family history all reviewed in electronic medical record.  No pertanent information unless stated regarding to the chief complaint.   Past Medical History:  Diagnosis Date   Anemia    Chicken pox    Clomid pregnancy    Condyloma acuminatum    H/O varicella    Migraines    Polycystic ovaries    Polyhydramnios in third trimester for IOL 09/27/2011   Polyhydramnios, antepartum complication    Postpartum care following vaginal delivery (9/17, 2nd deg perineal lac) 09/28/2011    Allergies  Allergen Reactions   Polytrim [Polymyxin B-Trimethoprim] Other (See Comments)    Eye swelling     Review of Systems:  No headache, visual changes, nausea, vomiting, diarrhea, constipation, dizziness, abdominal pain, skin rash, fevers, chills, night sweats, weight loss, swollen lymph nodes, body aches, joint swelling, chest pain, shortness of breath, mood changes. POSITIVE muscle aches  Objective  unknown if currently breastfeeding.   General: No apparent distress alert and oriented x3 mood and affect normal, dressed appropriately.  HEENT: Pupils equal, extraocular movements intact  Respiratory: Patient's speak in full sentences and does not appear short of breath  Cardiovascular: No lower extremity edema, non tender, no  erythema  Gait MSK:  Back   Osteopathic findings  C2 flexed rotated and side bent right C6 flexed rotated and side bent left T3 extended rotated and side bent right inhaled rib T9 extended rotated and side bent left L2 flexed rotated and side bent right Sacrum right on right       Assessment and Plan:  No problem-specific Assessment & Plan notes found for this encounter.    Nonallopathic problems  Decision today to treat with OMT was based on Physical Exam  After verbal consent patient was treated with HVLA, ME, FPR techniques in cervical, rib, thoracic, lumbar, and sacral  areas  Patient tolerated the procedure well with improvement in symptoms  Patient given exercises, stretches and lifestyle modifications  See medications in patient instructions if given  Patient will follow up in 4-8 weeks             Note: This dictation was prepared with Dragon dictation along with smaller phrase technology. Any transcriptional errors that result from this process are unintentional.

## 2022-03-16 ENCOUNTER — Ambulatory Visit: Payer: 59 | Admitting: Family Medicine

## 2022-05-04 ENCOUNTER — Other Ambulatory Visit: Payer: Self-pay

## 2022-05-05 ENCOUNTER — Other Ambulatory Visit: Payer: Self-pay

## 2022-06-10 ENCOUNTER — Encounter: Payer: No Typology Code available for payment source | Admitting: Family

## 2022-06-23 ENCOUNTER — Other Ambulatory Visit: Payer: Self-pay | Admitting: Family

## 2022-06-23 ENCOUNTER — Other Ambulatory Visit (HOSPITAL_COMMUNITY): Payer: Self-pay

## 2022-06-23 MED ORDER — MELOXICAM 15 MG PO TABS
15.0000 mg | ORAL_TABLET | Freq: Every day | ORAL | 1 refills | Status: DC
  Filled 2022-06-23: qty 30, 30d supply, fill #0

## 2022-06-24 ENCOUNTER — Other Ambulatory Visit: Payer: Self-pay

## 2022-06-24 ENCOUNTER — Encounter: Payer: Self-pay | Admitting: Family

## 2022-06-24 MED ORDER — OXYCODONE-ACETAMINOPHEN 7.5-325 MG PO TABS
1.0000 | ORAL_TABLET | Freq: Four times a day (QID) | ORAL | 0 refills | Status: DC | PRN
  Filled 2022-06-24 (×2): qty 30, 8d supply, fill #0

## 2022-06-24 MED ORDER — MUPIROCIN 2 % EX OINT
1.0000 | TOPICAL_OINTMENT | Freq: Two times a day (BID) | CUTANEOUS | 0 refills | Status: DC
  Filled 2022-06-24: qty 22, 11d supply, fill #0

## 2022-06-27 ENCOUNTER — Other Ambulatory Visit: Payer: Self-pay

## 2022-08-16 ENCOUNTER — Encounter: Payer: Self-pay | Admitting: Family

## 2022-08-16 ENCOUNTER — Ambulatory Visit (INDEPENDENT_AMBULATORY_CARE_PROVIDER_SITE_OTHER): Payer: 59 | Admitting: Family

## 2022-08-16 ENCOUNTER — Other Ambulatory Visit: Payer: Self-pay

## 2022-08-16 VITALS — BP 116/54 | HR 76 | Temp 98.0°F | Resp 16 | Ht 65.0 in | Wt 156.0 lb

## 2022-08-16 DIAGNOSIS — L709 Acne, unspecified: Secondary | ICD-10-CM | POA: Diagnosis not present

## 2022-08-16 DIAGNOSIS — M25562 Pain in left knee: Secondary | ICD-10-CM

## 2022-08-16 DIAGNOSIS — Z0001 Encounter for general adult medical examination with abnormal findings: Secondary | ICD-10-CM | POA: Diagnosis not present

## 2022-08-16 DIAGNOSIS — G8929 Other chronic pain: Secondary | ICD-10-CM

## 2022-08-16 DIAGNOSIS — Z0000 Encounter for general adult medical examination without abnormal findings: Secondary | ICD-10-CM

## 2022-08-16 MED ORDER — TRETINOIN (FACIAL WRINKLES) 0.02 % EX CREA
TOPICAL_CREAM | CUTANEOUS | 2 refills | Status: AC
  Filled 2022-08-16: qty 60, 90d supply, fill #0

## 2022-08-16 MED ORDER — MELOXICAM 15 MG PO TABS
15.0000 mg | ORAL_TABLET | Freq: Every day | ORAL | 1 refills | Status: DC
  Filled 2022-08-16 – 2022-09-07 (×2): qty 30, 30d supply, fill #0
  Filled 2023-02-22 – 2023-07-04 (×2): qty 30, 30d supply, fill #1

## 2022-08-16 NOTE — Assessment & Plan Note (Addendum)
Covid booster and flu shot in the fall. Pap up to date. Pt had labs completed at her job and will send me a copy of her lab report. Continue healthy diet and regular exercise.

## 2022-08-16 NOTE — Assessment & Plan Note (Signed)
Finding the Tretinoin gel very drying due to the alcohol base. Will see if we can get a cream covered instead. If not, suggested retrial of otc differin.

## 2022-08-16 NOTE — Assessment & Plan Note (Addendum)
Knee pain is still present. Continue Meloxicam 15mg  daily prn for pain.

## 2022-08-16 NOTE — Progress Notes (Signed)
Subjective:     Patient ID: Janet Holmes, female    DOB: [birth year], 37 y.o.   MRN: [id number]  Chief Complaint  Patient presents with   Annual Exam    HPI  Discussed the use of AI scribe software for clinical note transcription with the patient, who gave verbal consent to proceed.   37 year old female is in office today for her yearly physical. She is up to date with her pap smear. She noted some left knee pain today but states that is in her history and she takes the Meloxicam 15mg  for that and it is helpful. She states she does have some anxiety that is present but it is manageable. Wanted to ask if there was another medication for her acne other than the Tretinoin gel, she states that is drying her face out and is looking for another option maybe a cream.      Health Maintenance Due  Topic Date Due   COVID-19 Vaccine (4 - 2023-24 season) 09/10/2021   INFLUENZA VACCINE  08/11/2022    Past Medical History:  Diagnosis Date   Anemia    Chicken pox    Clomid pregnancy    Condyloma acuminatum    H/O varicella    Migraines    Polycystic ovaries    Polyhydramnios in third trimester for IOL 09/27/2011   Polyhydramnios, antepartum complication    Postpartum care following vaginal delivery (9/17, 2nd deg perineal lac) 09/28/2011    Past Surgical History:  Procedure Laterality Date   CESAREAN SECTION N/A 03/12/2015   Procedure: CESAREAN SECTION;  Surgeon: Olivia Mackie, MD;  Location: [facility];  Service: Obstetrics;  Laterality: N/A;   COMBINED ABDOMINOPLASTY AND LIPOSUCTION     with repair of rectus diastasis   KNEE ARTHROSCOPY     R   MOLE REMOVAL     WISDOM TOOTH EXTRACTION      Family History  Problem Relation Age of Onset   Cancer Paternal Grandmother        breast, skin   Hypertension Paternal Grandmother    Stroke Paternal Grandmother    Stroke Paternal Grandfather    Hypertension Paternal Grandfather    Diabetes Paternal Grandfather    Cancer Mother         skin (basal cell carcinoma)   Varicose Veins Mother    Cancer Maternal Grandmother        breast   Hypertension Maternal Grandfather    Stroke Maternal Grandfather    COPD Father        former smoker   Depression Father    Other Sister        frontal lobe disorder (hx of 6 concussions)   Anxiety disorder Brother        hx substance/alcohol abuse    Social History   Socioeconomic History   Marital status: Married    Spouse name: JOEY   Number of children: 2   Years of education: Not on file   Highest education level: Not on file  Occupational History   Occupation: NP  Tobacco Use   Smoking status: Former    Current packs/day: 0.00    Types: Cigarettes    Quit date: 05/17/2005    Years since quitting: 17.2   Smokeless tobacco: Never  Vaping Use   Vaping status: Never Used  Substance and Sexual Activity   Alcohol use: No   Drug use: No   Sexual activity: Yes    Partners: Male  Birth control/protection: None, I.U.D.  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, used to ride [employer] competitively, reading,    One dog and one Medical laboratory scientific officer   Social Determinants of Corporate investment banker Strain: Low Risk  (05/17/2017)   Overall Financial Resource Strain (CARDIA)    Difficulty of Paying Living Expenses: Not hard at all  Food Insecurity: No Food Insecurity (05/17/2017)   Hunger Vital Sign    Worried About Running Out of Food in the Last Year: Never true    Ran Out of Food in the Last Year: Never true  Transportation Needs: Unknown (05/17/2017)   PRAPARE - Administrator, Civil Service (Medical): Not on file    Lack of Transportation (Non-Medical): No  Physical Activity: Sufficiently Active (05/17/2017)   Exercise Vital Sign    Days of Exercise per Week: 4 days    Minutes of Exercise per Session: 70  min  Stress: Stress Concern Present (05/17/2017)   [employer] of Occupational Health - Occupational Stress Questionnaire    Feeling of Stress : To some extent  Social Connections: Moderately Integrated (05/17/2017)   Social Connection and Isolation Panel [NHANES]    Frequency of Communication with Friends and Family: Three times a week    Frequency of Social Gatherings with Friends and Family: Once a week    Attends Religious Services: Never    Database administrator or Organizations: Yes    Attends Engineer, structural: More than 4 times per year    Marital Status: Married  Catering manager Violence: Not At Risk (05/17/2017)   Humiliation, Afraid, Rape, and Kick questionnaire    Fear of Current or Ex-Partner: No    Emotionally Abused: No    Physically Abused: No    Sexually Abused: No    Outpatient Medications Prior to Visit  Medication Sig Dispense Refill   betamethasone valerate ointment (VALISONE) 0.1 % Apply 1 application topically 2 (two) times daily. 30 g 1   mupirocin ointment (BACTROBAN) 2 % Apply 1 Application topically 2 (two) times daily. 22 g 0   SUMAtriptan (IMITREX) 50 MG tablet Take one tablet by mouth at start of migraine, may repeat in 2 hours if symptoms not improved 10 tablet 5   meloxicam (MOBIC) 15 MG tablet Take 1 tablet (15 mg total) by mouth daily. 30 tablet 1   tretinoin (RETIN-A) 0.025 % gel Apply topically at bedtime. 45 g 1   oxyCODONE-acetaminophen (PERCOCET) 7.5-325 MG tablet Take 1 tablet by mouth every 6 (six) hours as needed for pain. 30 tablet 0   Vitamin D, Ergocalciferol, (DRISDOL) 1.25 MG (50000 UNIT) CAPS capsule Take 1 capsule (50,000 Units total) by mouth every 7 (seven) days. 12 capsule 0   No facility-administered medications prior to visit.    Allergies  Allergen Reactions   Polytrim [Polymyxin B-Trimethoprim] Other (See Comments)    Eye swelling    Review of Systems  Constitutional:  Negative for chills, fever and weight  loss.  HENT:  Negative for ear pain, hearing loss, nosebleeds and sore throat.   Eyes:  Negative for blurred vision, double vision, pain and discharge.  Respiratory:  Negative for shortness of breath.   Cardiovascular:  Negative for chest pain, palpitations and leg  swelling.  Gastrointestinal:  Negative for diarrhea, heartburn, nausea and vomiting.  Genitourinary:  Negative for frequency and urgency.  Musculoskeletal:  Positive for joint pain (left knee). Negative for back pain.  Skin:  Negative for itching and rash.  Neurological:  Negative for dizziness, sensory change, speech change, focal weakness and headaches.  Psychiatric/Behavioral:  Negative for depression and suicidal ideas. The patient is nervous/anxious (some anxiety noted).        Objective:    Physical Exam Constitutional:      Appearance: Normal appearance. She is normal weight.  HENT:     Head: Normocephalic.     Right Ear: Tympanic membrane, ear canal and external ear normal.     Left Ear: Tympanic membrane, ear canal and external ear normal.     Nose: Nose normal.     Mouth/Throat:     Mouth: Mucous membranes are moist.     Pharynx: Oropharynx is clear.  Eyes:     Pupils: Pupils are equal, round, and reactive to light.  Cardiovascular:     Rate and Rhythm: Normal rate and regular rhythm.     Pulses: Normal pulses.     Heart sounds: Normal heart sounds.  Pulmonary:     Effort: Pulmonary effort is normal.     Breath sounds: Normal breath sounds.  Abdominal:     General: Abdomen is flat. Bowel sounds are normal.     Palpations: Abdomen is soft.  Musculoskeletal:        General: Normal range of motion.     Cervical back: Normal range of motion.  Skin:    General: Skin is warm.     Capillary Refill: Capillary refill takes less than 2 seconds.     Comments: Mild facial acne noted  Neurological:     General: No focal deficit present.     Mental Status: She is alert and oriented to person, place, and time.  Mental status is at baseline.     Comments: Bilateral UE/LE strength is 5/5  Psychiatric:        Mood and Affect: Mood normal.        Behavior: Behavior normal.        Thought Content: Thought content normal.        Judgment: Judgment normal.      BP (!) 116/54 (BP Location: Right Arm, Patient Position: Sitting, Cuff Size: Small)   Pulse 76   Temp 98 F (36.7 C) (Oral)   Resp 16   Ht 5\' 5"  (1.651 m)   Wt 156 lb (70.8 kg)   SpO2 98%   BMI 25.96 kg/m  Wt Readings from Last 3 Encounters:  08/16/22 156 lb (70.8 kg)  01/05/22 163 lb (73.9 kg)  11/24/21 160 lb (72.6 kg)       Assessment & Plan:    Problem List Items Addressed This Visit       Unprioritized   Preventative health care - Primary    Covid booster and flu shot in the fall. Pap up to date. Pt had labs completed at her job and will send me a copy of her lab report. Continue healthy diet and regular exercise.       Chronic pain of left knee    Knee pain is still present. Continue Meloxicam 15mg  daily prn for pain.       Relevant Medications   meloxicam (MOBIC) 15 MG tablet   Acne    Finding the Tretinoin gel very drying due to the  alcohol base. Will see if we can get a cream covered instead. If not, suggested retrial of otc differin.        I have discontinued Gomez Cleverly. Casique's tretinoin, Vitamin D (Ergocalciferol), and oxyCODONE-acetaminophen. I am also having her start on Tretinoin (Facial Wrinkles). Additionally, I am having her maintain her betamethasone valerate ointment, SUMAtriptan, mupirocin ointment, and meloxicam.  Meds ordered this encounter  Medications   meloxicam (MOBIC) 15 MG tablet    Sig: Take 1 tablet (15 mg total) by mouth daily.    Dispense:  30 tablet    Refill:  1    Order Specific Question:   Supervising Provider    Answer:   Danise Edge A [[id number]]   Tretinoin, Facial Wrinkles, 0.02 % CREA    Sig: Use once daily as needed    Dispense:  60 g    Refill:  2    Order Specific  Question:   Supervising Provider    Answer:   Danise Edge A [[id number]]

## 2022-08-16 NOTE — Patient Instructions (Signed)
Please continue healthy diet and regular exercise.  Please get your flu and covid booster shots this fall.

## 2022-08-17 ENCOUNTER — Encounter: Payer: Self-pay | Admitting: Family

## 2022-08-17 ENCOUNTER — Other Ambulatory Visit: Payer: Self-pay

## 2022-08-18 ENCOUNTER — Other Ambulatory Visit: Payer: Self-pay

## 2022-08-22 ENCOUNTER — Other Ambulatory Visit: Payer: Self-pay

## 2022-09-07 ENCOUNTER — Other Ambulatory Visit: Payer: Self-pay

## 2022-09-08 ENCOUNTER — Other Ambulatory Visit: Payer: Self-pay

## 2022-09-15 ENCOUNTER — Other Ambulatory Visit: Payer: Self-pay | Admitting: Oncology

## 2022-09-15 DIAGNOSIS — Z006 Encounter for examination for normal comparison and control in clinical research program: Secondary | ICD-10-CM

## 2022-09-27 ENCOUNTER — Other Ambulatory Visit (HOSPITAL_COMMUNITY): Payer: Self-pay

## 2022-10-12 ENCOUNTER — Other Ambulatory Visit: Payer: Self-pay

## 2023-01-10 ENCOUNTER — Other Ambulatory Visit (HOSPITAL_COMMUNITY)
Admission: RE | Admit: 2023-01-10 | Discharge: 2023-01-10 | Disposition: A | Discharge status: Home / Self Care | Payer: Self-pay | Source: Ambulatory Visit | Attending: Oncology | Admitting: Oncology

## 2023-01-10 DIAGNOSIS — Z006 Encounter for examination for normal comparison and control in clinical research program: Secondary | ICD-10-CM

## 2023-01-23 ENCOUNTER — Other Ambulatory Visit: Payer: Self-pay | Admitting: Family

## 2023-01-23 ENCOUNTER — Other Ambulatory Visit: Payer: Self-pay

## 2023-01-23 LAB — GENECONNECT MOLECULAR SCREEN: Genetic Analysis Overall Interpretation: NEGATIVE

## 2023-01-23 MED ORDER — PREDNISONE 20 MG PO TABS
20.0000 mg | ORAL_TABLET | Freq: Every day | ORAL | 0 refills | Status: DC
  Filled 2023-01-23: qty 20, 20d supply, fill #0

## 2023-01-25 ENCOUNTER — Other Ambulatory Visit: Payer: Self-pay

## 2023-01-25 ENCOUNTER — Ambulatory Visit: Payer: 59 | Admitting: Family Medicine

## 2023-01-25 ENCOUNTER — Ambulatory Visit (INDEPENDENT_AMBULATORY_CARE_PROVIDER_SITE_OTHER): Payer: 59

## 2023-01-25 VITALS — BP 132/76 | HR 64 | Ht 65.0 in | Wt 158.0 lb

## 2023-01-25 DIAGNOSIS — M25511 Pain in right shoulder: Secondary | ICD-10-CM

## 2023-01-25 DIAGNOSIS — G8929 Other chronic pain: Secondary | ICD-10-CM | POA: Diagnosis not present

## 2023-01-25 NOTE — Patient Instructions (Addendum)
 Thank you for coming in today.  Please get an Xray today before you leave   I've referred you to Physical Therapy.  Let us  know if you don't hear from them in one week.   Consider reschedule for injection very late Jan or early Dec .

## 2023-01-25 NOTE — Progress Notes (Signed)
 Joanna Muck, PhD, LAT, ATC acting as a scribe for Garlan Juniper, MD.  Janet Holmes is a 38 y.o. female who presents to [facility] today for R shoulder pain. Pt was previously seen by Dr. Felipe Horton on 01/05/22 for L knee pain and OMT.  Today, pt c/o R shoulder pain x 1 month w/ insidious onset. No injury. She feels like this is an overuse pain do to crossfit. Pt locates pain to the anterior aspect of the R shoulder and an area along the R trapz. Pain seems to be worse at night.   Radiates: yes- to R elbow Aggravates: overhead Treatments tried: meloxicam , prednisone , rest, ice  She is an active participant in CrossFit doing CrossFit competitions.  She has an important competition for CrossFit on February 8.  She works as a Publishing rights manager and infectious disease.  Pertinent review of systems: No fevers or chills  Relevant historical information: History of a stress fracture.   Exam:  BP 132/76   Pulse 64   Ht 5\' 5"  (1.651 m)   Wt 158 lb (71.7 kg)   SpO2 97%   BMI 26.29 kg/m  General: Well Developed, well nourished, and in no acute distress.   MSK: Right shoulder well-developed musculature normal-appearing otherwise. Normal motion. Nontender. Intact strength. Positive Hawkins and Neer's test. Negative Yergason's and speeds test.    Lab and Radiology Results  [facility] MSK Ultrasound of: Right shoulder Biceps tendon intact normal. Subscapularis tendon is intact.   Supraspinatus tendon intact.  Hyperechoic change present mid substance tendon indicating chronic calcific tendinitis. No visible tear is present. Infraspinatus tendon is normal-appearing AC joint normal appearing Impression: Chronic calcific tendinitis supraspinatus tendon.  X-ray images right shoulder obtained today personally and independently interpreted. No acute fractures.  No significant degenerative changes. Await formal radiology  review   Assessment and Plan: 38 y.o. female with chronic right shoulder pain worse with overhead motion and lifting.  Her CrossFit participation does put her at increased risk for shoulder pain and tendinopathy.  She should be able to continue to participate in lifting with modification as guided by pain.  Plan for continued course of prednisone  already prescribed and will refer to physical therapy.  Will use [facility].  She has an important CrossFit event on February 8.  Consider steroid injection right before that event in very late January or early February.   PDMP not reviewed this encounter. Orders Placed This Encounter  Procedures   DG Shoulder Right    Standing Status:   Future    Number of Occurrences:   1    Expiration Date:   01/25/2024    Reason for Exam (SYMPTOM  OR DIAGNOSIS REQUIRED):   right shoulder pain    Preferred imaging location?:   [facility]    Is patient pregnant?:   No   US  LIMITED JOINT SPACE STRUCTURES UP RIGHT(NO LINKED CHARGES)    Reason for Exam (SYMPTOM  OR DIAGNOSIS REQUIRED):   right shoulder pain    Preferred imaging location?:   [facility] referral to Physical Therapy    Referral Priority:   Routine    Referral Type:   Physical Medicine    Referral Reason:   Specialty Services Required    Requested Specialty:   Physical Therapy    Number of Visits Requested:   1   No orders of the defined types were placed in this encounter.  Discussed warning signs or symptoms. Please see discharge instructions. Patient expresses understanding.   The above documentation has been reviewed and is accurate and complete Garlan Juniper, M.D.

## 2023-01-30 ENCOUNTER — Encounter: Payer: Self-pay | Admitting: Family Medicine

## 2023-01-30 NOTE — Progress Notes (Signed)
Right shoulder x-ray looks normal to radiology

## 2023-02-10 ENCOUNTER — Ambulatory Visit: Payer: 59 | Admitting: Family Medicine

## 2023-02-15 ENCOUNTER — Other Ambulatory Visit: Payer: Self-pay | Admitting: Family

## 2023-02-15 ENCOUNTER — Other Ambulatory Visit: Payer: Self-pay

## 2023-02-15 MED ORDER — PREDNISONE 20 MG PO TABS
20.0000 mg | ORAL_TABLET | Freq: Every day | ORAL | 0 refills | Status: DC
  Filled 2023-02-15: qty 20, 20d supply, fill #0

## 2023-02-22 ENCOUNTER — Other Ambulatory Visit: Payer: Self-pay

## 2023-02-28 ENCOUNTER — Ambulatory Visit: Payer: 59 | Admitting: Family Medicine

## 2023-03-03 ENCOUNTER — Other Ambulatory Visit: Payer: Self-pay

## 2023-05-03 DIAGNOSIS — H5213 Myopia, bilateral: Secondary | ICD-10-CM | POA: Diagnosis not present

## 2023-05-04 LAB — LAB REPORT - SCANNED
A1c: 5.3
TSH: 2.86 (ref 0.41–5.90)

## 2023-07-04 ENCOUNTER — Other Ambulatory Visit: Payer: Self-pay

## 2023-07-25 ENCOUNTER — Other Ambulatory Visit: Payer: Self-pay

## 2023-07-25 ENCOUNTER — Other Ambulatory Visit: Payer: Self-pay | Admitting: Family

## 2023-07-25 MED ORDER — MUPIROCIN 2 % EX OINT
1.0000 | TOPICAL_OINTMENT | Freq: Two times a day (BID) | CUTANEOUS | 1 refills | Status: AC
  Filled 2023-07-25: qty 22, 11d supply, fill #0

## 2023-07-26 ENCOUNTER — Other Ambulatory Visit: Payer: Self-pay

## 2023-07-26 ENCOUNTER — Other Ambulatory Visit (HOSPITAL_COMMUNITY): Payer: Self-pay

## 2023-08-04 ENCOUNTER — Other Ambulatory Visit: Payer: Self-pay | Admitting: Family

## 2023-08-04 ENCOUNTER — Other Ambulatory Visit: Payer: Self-pay

## 2023-08-04 MED ORDER — PREDNISONE 20 MG PO TABS
20.0000 mg | ORAL_TABLET | Freq: Every day | ORAL | 0 refills | Status: DC
  Filled 2023-08-04: qty 20, 20d supply, fill #0

## 2023-08-21 ENCOUNTER — Encounter: Payer: 59 | Admitting: Family

## 2023-08-22 ENCOUNTER — Ambulatory Visit (INDEPENDENT_AMBULATORY_CARE_PROVIDER_SITE_OTHER): Payer: 59 | Admitting: Family

## 2023-08-22 ENCOUNTER — Encounter: Payer: Self-pay | Admitting: Family

## 2023-08-22 VITALS — BP 103/56 | HR 68 | Resp 16 | Ht 65.0 in | Wt 165.0 lb

## 2023-08-22 DIAGNOSIS — Z0000 Encounter for general adult medical examination without abnormal findings: Secondary | ICD-10-CM | POA: Diagnosis not present

## 2023-08-22 NOTE — Patient Instructions (Signed)
 VISIT SUMMARY:  Today, we reviewed your overall health and addressed your concerns about elevated mean corpuscular volume A1c levels.  We also discussed your well-controlled migraines and periodic tendinitis.  YOUR PLAN:  ELEVATED MEAN CORPUSCULAR VOLUME: Your mean corpuscular volume is elevated at 100. This is not related to exercise or red blood cell turnover. -Consider adding a multivitamin once daily.  A1C LEVELS AND FASTING INSULIN: Your A1c levels are higher than expected, and your fasting insulin levels are consistently low. -We will continue to monitor these levels closely.  MIGRAINE: Your migraines are well-controlled with magnesium supplementation and the removal of your Mirena  IUD. -Continue taking magnesium at night. -No need for Imitrex  at this time.  TENDINITIS: You experience periodic tendinitis flares, but you are currently asymptomatic.

## 2023-08-22 NOTE — Progress Notes (Signed)
 Subjective:     Patient ID: Janet Holmes, female    DOB: 21-Aug-1985, 39 y.o.   MRN: [id number]  Chief Complaint  Patient presents with   Annual Exam    HPI  Discussed the use of AI scribe software for clinical note transcription with the patient, who gave verbal consent to proceed.  History of Present Illness  Janet Holmes is a 38 year old female who presents for a routine follow-up visit.  Her mean corpuscular volume is elevated at 100. She questions if this could be related to exercise or red blood cell turnover. She does not take a multivitamin but regularly takes magnesium, saffron tablets, and creatine. Her A1c levels are higher than expected, and fasting insulin levels are consistently low at around three.  She received a Hepatitis B booster in 2015 and has deferred the HPV vaccine due to normal Pap smears and trust in her marital relationship. Her last Pap smear was in 2023, and she is seeking a new provider for future exams. Vision and dental check-ups are current.  She experiences periodic tendinitis flares, managed with prednisone  and meloxicam . She started magnesium at night and removed her Mirena  IUD which she feels has improved her migraines. Her husband has had a vasectomy, so she is not using other forms of birth control. No concerns about depression or anxiety.      Health Maintenance Due  Topic Date Due   COVID-19 Vaccine (4 - 2024-25 season) 09/11/2022   INFLUENZA VACCINE  08/11/2023    Past Medical History:  Diagnosis Date   Anemia    Chicken pox    Clomid pregnancy    Condyloma acuminatum    H/O varicella    Migraines    Polycystic ovaries    Polyhydramnios in third trimester for IOL 09/27/2011   Polyhydramnios, antepartum complication    Postpartum care following vaginal delivery (9/17, 2nd deg perineal lac) 09/28/2011    Past Surgical History:  Procedure Laterality Date   CESAREAN SECTION N/A 03/12/2015   Procedure: CESAREAN SECTION;   Surgeon: Charlie Flowers, MD;  Location: [facility];  Service: Obstetrics;  Laterality: N/A;   COMBINED ABDOMINOPLASTY AND LIPOSUCTION     with repair of rectus diastasis   KNEE ARTHROSCOPY     R   MOLE REMOVAL     WISDOM TOOTH EXTRACTION      Family History  Problem Relation Age of Onset   Cancer Paternal Grandmother        breast, skin   Hypertension Paternal Grandmother    Stroke Paternal Grandmother    Stroke Paternal Grandfather    Hypertension Paternal Grandfather    Diabetes Paternal Grandfather    Cancer Mother        skin (basal cell carcinoma)   Varicose Veins Mother    Cancer Maternal Grandmother        breast   Hypertension Maternal Grandfather    Stroke Maternal Grandfather    COPD Father        former smoker   Depression Father    Other Sister        frontal lobe disorder (hx of 6 concussions)   Anxiety disorder Brother        hx substance/alcohol abuse    Social History   Socioeconomic History   Marital status: Married    Spouse name: JOEY   Number of children: 2   Years of education: Not on file   Highest education level: Not on file  Occupational History   Occupation: NP  Tobacco Use   Smoking status: Former    Current packs/day: 0.00    Types: Cigarettes    Quit date: 05/17/2005    Years since quitting: 18.2   Smokeless tobacco: Never  Vaping Use   Vaping status: Never Used  Substance and Sexual Activity   Alcohol use: No   Drug use: No   Sexual activity: Yes    Partners: Male    Birth control/protection: None, I.U.D.  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, used to ride [employer] competitively, reading,    One dog and one Medical laboratory scientific officer   Social Drivers of Corporate investment banker Strain: Low Risk  (05/17/2017)   Overall Financial Resource Strain (CARDIA)    Difficulty of Paying  Living Expenses: Not hard at all  Food Insecurity: No Food Insecurity (05/17/2017)   Hunger Vital Sign    Worried About Running Out of Food in the Last Year: Never true    Ran Out of Food in the Last Year: Never true  Transportation Needs: Unknown (05/17/2017)   PRAPARE - Administrator, Civil Service (Medical): Not on file    Lack of Transportation (Non-Medical): No  Physical Activity: Sufficiently Active (05/17/2017)   Exercise Vital Sign    Days of Exercise per Week: 4 days    Minutes of Exercise per Session: 70 min  Stress: Stress Concern Present (05/17/2017)   [employer] of Occupational Health - Occupational Stress Questionnaire    Feeling of Stress : To some extent  Social Connections: Moderately Integrated (05/17/2017)   Social Connection and Isolation Panel    Frequency of Communication with Friends and Family: Three times a week    Frequency of Social Gatherings with Friends and Family: Once a week    Attends Religious Services: Never    Database administrator or Organizations: Yes    Attends Engineer, structural: More than 4 times per year    Marital Status: Married  Catering manager Violence: Not At Risk (05/17/2017)   Humiliation, Afraid, Rape, and Kick questionnaire    Fear of Current or Ex-Partner: No    Emotionally Abused: No    Physically Abused: No    Sexually Abused: No    Outpatient Medications Prior to Visit  Medication Sig Dispense Refill   betamethasone  valerate ointment (VALISONE ) 0.1 % Apply 1 application topically 2 (two) times daily. 30 g 1   Cholecalciferol (DIALYVITE VITAMIN D  5000 PO) Take by mouth.     MAGNESIUM CITRATE PO Take by mouth.     meloxicam  (MOBIC ) 15 MG tablet Take 1 tablet (15 mg total) by mouth daily. 30 tablet 1   mupirocin  ointment (BACTROBAN ) 2 % Apply 1 Application topically 2 (two) times daily. 22 g 1   predniSONE  (DELTASONE ) 20 MG tablet Take 1 tablet (20 mg total) by mouth daily with breakfast. 20 tablet 0    SUMAtriptan  (IMITREX ) 50 MG tablet Take one tablet by mouth at start of migraine, may repeat in 2 hours if symptoms not improved 10 tablet 5   Tretinoin , Facial Wrinkles, 0.02 % CREA Use once daily as needed 60 g 2   No facility-administered medications prior to visit.    Allergies  Allergen  Reactions   Polytrim [Polymyxin B-Trimethoprim] Other (See Comments)    Eye swelling    Review of Systems  Constitutional:  Negative for weight loss.  HENT:  Negative for congestion and hearing loss.   Eyes:  Negative for blurred vision.  Respiratory:  Negative for cough.   Cardiovascular:  Negative for leg swelling.  Gastrointestinal:  Negative for constipation and diarrhea.  Genitourinary:  Negative for dysuria and frequency.  Musculoskeletal:  Negative for joint pain and myalgias.  Skin:  Negative for rash.  Neurological:  Negative for headaches.  Psychiatric/Behavioral:  Negative for depression. The patient is not nervous/anxious.        Objective:    Physical Exam   BP (!) 103/56 (BP Location: Right Arm, Patient Position: Sitting, Cuff Size: Normal)   Pulse 68   Resp 16   Ht 5' 5 (1.651 m)   Wt 165 lb (74.8 kg)   SpO2 97%   BMI 27.46 kg/m  Wt Readings from Last 3 Encounters:  08/22/23 165 lb (74.8 kg)  01/25/23 158 lb (71.7 kg)  08/16/22 156 lb (70.8 kg)   Physical Exam  Constitutional: She is oriented to person, place, and time. She appears well-developed and well-nourished. No distress.  HENT:  Head: Normocephalic and atraumatic.  Right Ear: Tympanic membrane and ear canal normal.  Left Ear: Tympanic membrane and ear canal normal.  Mouth/Throat: Oropharynx is clear and moist.  Eyes: Pupils are equal, round, and reactive to light. No scleral icterus.  Neck: Normal range of motion. No thyromegaly present.  Cardiovascular: Normal rate and regular rhythm.   No murmur heard. Pulmonary/Chest: Effort normal and breath sounds normal. No respiratory distress. He has no  wheezes. She has no rales. She exhibits no tenderness.  Abdominal: Soft. Bowel sounds are normal. She exhibits no distension and no mass. There is no tenderness. There is no rebound and no guarding.  Musculoskeletal: She exhibits no edema.  Lymphadenopathy:    She has no cervical adenopathy.  Neurological: She is alert and oriented to person, place, and time. She has normal patellar reflexes. She exhibits normal muscle tone. Coordination normal.  Skin: Skin is warm and dry.  Psychiatric: She has a normal mood and affect. Her behavior is normal. Judgment and thought content normal.  Breast/Pelvic: deferred          Assessment & Plan:       Assessment & Plan:   Problem List Items Addressed This Visit       Unprioritized   Preventative health care - Primary   Continue healthy diet and regular exercise.  Her BMI is elevated, but she has a lot of lean body mass so I don't believe her body fat composition is elevated.  Had Hep B series in the past.  Declines Gardisil vaccine. Tdap up to date. Pap up to date. Plan to start mammograms next year.       Outside lab work reviewed and stable- will be scanned into medical record.  I am having Janet Holmes maintain her betamethasone  valerate ointment, SUMAtriptan , meloxicam , Tretinoin  (Facial Wrinkles), mupirocin  ointment, predniSONE , MAGNESIUM CITRATE PO, and Cholecalciferol (DIALYVITE VITAMIN D  5000 PO).  No orders of the defined types were placed in this encounter.

## 2023-08-22 NOTE — Assessment & Plan Note (Addendum)
 Continue healthy diet and regular exercise.  Her BMI is elevated, but she has a lot of lean body mass so I don't believe her body fat composition is elevated.  Had Hep B series in the past.  Declines Gardisil vaccine. Tdap up to date. Pap up to date. Plan to start mammograms next year.

## 2023-12-16 ENCOUNTER — Other Ambulatory Visit: Payer: Self-pay | Admitting: Family

## 2023-12-17 MED ORDER — MELOXICAM 15 MG PO TABS
15.0000 mg | ORAL_TABLET | Freq: Every day | ORAL | 1 refills | Status: AC
  Filled 2023-12-17: qty 30, 30d supply, fill #0

## 2023-12-17 MED ORDER — BETAMETHASONE VALERATE 0.1 % EX OINT
1.0000 | TOPICAL_OINTMENT | Freq: Two times a day (BID) | CUTANEOUS | 1 refills | Status: AC
  Filled 2023-12-17: qty 30, 15d supply, fill #0

## 2023-12-18 ENCOUNTER — Other Ambulatory Visit: Payer: Self-pay

## 2023-12-31 ENCOUNTER — Encounter: Payer: Self-pay | Admitting: Family

## 2023-12-31 MED ORDER — PREDNISONE 10 MG PO TABS
10.0000 mg | ORAL_TABLET | Freq: Two times a day (BID) | ORAL | 0 refills | Status: AC
  Filled 2023-12-31: qty 30, 15d supply, fill #0

## 2024-01-01 ENCOUNTER — Other Ambulatory Visit: Payer: Self-pay

## 2024-01-02 ENCOUNTER — Other Ambulatory Visit: Payer: Self-pay

## 2024-01-17 ENCOUNTER — Ambulatory Visit: Admitting: Family Medicine

## 2024-08-28 ENCOUNTER — Encounter: Admitting: Family
